# Patient Record
Sex: Female | Born: 1975 | Race: White | Hispanic: No | Marital: Married | Smoking: Former smoker
Health system: Southern US, Community
[De-identification: ages and names within clinical notes are randomized; demographics above are authoritative.]

## PROBLEM LIST (undated history)

## (undated) DIAGNOSIS — F329 Major depressive disorder, single episode, unspecified: Secondary | ICD-10-CM

## (undated) DIAGNOSIS — Z30432 Encounter for removal of intrauterine contraceptive device: Secondary | ICD-10-CM

## (undated) DIAGNOSIS — F419 Anxiety disorder, unspecified: Secondary | ICD-10-CM

## (undated) DIAGNOSIS — J301 Allergic rhinitis due to pollen: Secondary | ICD-10-CM

## (undated) DIAGNOSIS — F32A Depression, unspecified: Secondary | ICD-10-CM

## (undated) DIAGNOSIS — Z85850 Personal history of malignant neoplasm of thyroid: Secondary | ICD-10-CM

## (undated) DIAGNOSIS — E890 Postprocedural hypothyroidism: Secondary | ICD-10-CM

## (undated) DIAGNOSIS — Z975 Presence of (intrauterine) contraceptive device: Secondary | ICD-10-CM

## (undated) DIAGNOSIS — G43909 Migraine, unspecified, not intractable, without status migrainosus: Secondary | ICD-10-CM

## (undated) DIAGNOSIS — M26629 Arthralgia of temporomandibular joint, unspecified side: Secondary | ICD-10-CM

## (undated) DIAGNOSIS — D251 Intramural leiomyoma of uterus: Secondary | ICD-10-CM

## (undated) DIAGNOSIS — R87629 Unspecified abnormal cytological findings in specimens from vagina: Secondary | ICD-10-CM

## (undated) DIAGNOSIS — N200 Calculus of kidney: Secondary | ICD-10-CM

## (undated) HISTORY — DX: Depression, unspecified: F32.A

## (undated) HISTORY — DX: Unspecified abnormal cytological findings in specimens from vagina: R87.629

## (undated) HISTORY — DX: Encounter for removal of intrauterine contraceptive device: Z30.432

## (undated) HISTORY — DX: Intramural leiomyoma of uterus: D25.1

## (undated) HISTORY — PX: TONSILLECTOMY AND ADENOIDECTOMY: SHX28

## (undated) HISTORY — DX: Allergic rhinitis due to pollen: J30.1

## (undated) HISTORY — DX: Postprocedural hypothyroidism: E89.0

## (undated) HISTORY — DX: Arthralgia of temporomandibular joint, unspecified side: M26.629

## (undated) HISTORY — DX: Personal history of malignant neoplasm of thyroid: Z85.850

## (undated) HISTORY — DX: Migraine, unspecified, not intractable, without status migrainosus: G43.909

## (undated) HISTORY — DX: Anxiety disorder, unspecified: F41.9

## (undated) HISTORY — DX: Major depressive disorder, single episode, unspecified: F32.9

## (undated) HISTORY — DX: Calculus of kidney: N20.0

## (undated) HISTORY — DX: Presence of (intrauterine) contraceptive device: Z97.5

---
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
## 1999-01-22 ENCOUNTER — Emergency Department (HOSPITAL_COMMUNITY): Admission: EM | Admit: 1999-01-22 | Discharge: 1999-01-22 | Payer: Self-pay | Admitting: Emergency Medicine

## 1999-04-11 DIAGNOSIS — Z85850 Personal history of malignant neoplasm of thyroid: Secondary | ICD-10-CM

## 1999-04-11 HISTORY — PX: OTHER SURGICAL HISTORY: SHX169

## 1999-04-11 HISTORY — DX: Personal history of malignant neoplasm of thyroid: Z85.850

## 2000-04-10 DIAGNOSIS — E890 Postprocedural hypothyroidism: Secondary | ICD-10-CM

## 2000-04-10 HISTORY — DX: Postprocedural hypothyroidism: E89.0

## 2000-07-24 ENCOUNTER — Other Ambulatory Visit: Admission: RE | Admit: 2000-07-24 | Discharge: 2000-07-24 | Payer: Self-pay | Admitting: Obstetrics and Gynecology

## 2000-09-12 ENCOUNTER — Ambulatory Visit (HOSPITAL_COMMUNITY): Admission: RE | Admit: 2000-09-12 | Discharge: 2000-09-12 | Payer: Self-pay | Admitting: Endocrinology

## 2003-09-02 ENCOUNTER — Ambulatory Visit (HOSPITAL_COMMUNITY): Admission: RE | Admit: 2003-09-02 | Discharge: 2003-09-02 | Payer: Self-pay | Admitting: Pediatrics

## 2010-04-10 DIAGNOSIS — N200 Calculus of kidney: Secondary | ICD-10-CM

## 2010-04-10 HISTORY — DX: Calculus of kidney: N20.0

## 2011-08-09 LAB — HM PAP SMEAR: HM Pap smear: NORMAL

## 2011-09-27 ENCOUNTER — Other Ambulatory Visit (HOSPITAL_COMMUNITY)
Admission: RE | Admit: 2011-09-27 | Discharge: 2011-09-27 | Disposition: A | Discharge status: Home / Self Care | Payer: 59 | Source: Ambulatory Visit | Attending: Obstetrics and Gynecology | Admitting: Obstetrics and Gynecology

## 2011-09-27 DIAGNOSIS — Z1159 Encounter for screening for other viral diseases: Secondary | ICD-10-CM | POA: Insufficient documentation

## 2011-09-27 DIAGNOSIS — Z01419 Encounter for gynecological examination (general) (routine) without abnormal findings: Secondary | ICD-10-CM | POA: Insufficient documentation

## 2012-01-31 ENCOUNTER — Encounter: Payer: Self-pay | Admitting: Family Medicine

## 2012-01-31 ENCOUNTER — Ambulatory Visit (INDEPENDENT_AMBULATORY_CARE_PROVIDER_SITE_OTHER): Payer: 59 | Admitting: Family Medicine

## 2012-01-31 VITALS — BP 109/71 | HR 89 | Temp 98.6°F | Ht 59.0 in | Wt 127.0 lb

## 2012-01-31 DIAGNOSIS — E039 Hypothyroidism, unspecified: Secondary | ICD-10-CM

## 2012-01-31 DIAGNOSIS — Z85850 Personal history of malignant neoplasm of thyroid: Secondary | ICD-10-CM

## 2012-01-31 LAB — T3: T3, Total: 143.9 ng/dL (ref 80.0–204.0)

## 2012-01-31 NOTE — Assessment & Plan Note (Signed)
Check TSH, free T4 and T3 total today. We do want to keep her TSH near 0.2 or so with her hx of thyroid cancer. No RF of med today--we'll see if dose change of synthroid is needed.

## 2012-01-31 NOTE — Progress Notes (Signed)
Office Note 01/31/2012  CC:  Chief Complaint  Patient presents with  . Establish Care    HPI:  Melissa Mullen is a 36 y.o. White female who is here to establish care. Patient's most recent primary MD: [name] at [facility] in [place].   GYN care with [facility] in [place] ([name]). Old records in EPIC/HL were reviewed prior to or during today's visit.  Last TSH was July and was around 0.8 and brand name synthroid was increased from 100 to 112 mcg qd.  Past Medical History  Diagnosis Date  . Hypothyroidism   . Migraine syndrome     very sporadic, triggers unknown  . Anxiety   . Depression   . Hay fever   . Nephrolithiasis 2012    No imaging has been done.    Past Surgical History  Procedure Date  . Thyroidectomy 2001    +Rad Iodine ablation  . Tonsillectomy and adenoidectomy 1980s  . Wisdom tooth extraction 1996    No family history on file.  History   Social History  . Marital Status: Married    Spouse Name: N/A    Number of Children: N/A  . Years of Education: N/A   Occupational History  . Not on file.   Social History Main Topics  . Smoking status: Former Smoker    Types: Cigarettes    Quit date: 04/10/2010  . Smokeless tobacco: Never Used  . Alcohol Use: No  . Drug Use: No  . Sexually Active: Not on file   Other Topics Concern  . 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  No Tob, minimal alc.Exercise: sporadically    Outpatient Encounter Prescriptions as of 01/31/2012  Medication Sig Dispense Refill  . ALPRAZolam (XANAX) 0.25 MG tablet Take 0.25 mg by mouth as needed.      . cyclobenzaprine (FLEXERIL) 10 MG tablet Take 10 mg by mouth as needed.      . SUMAtriptan (IMITREX) 100 MG tablet Take 100 mg by mouth every 2 (two) hours as needed.      Marland Kitchen SYNTHROID 112 MCG tablet Take 1 tablet by mouth Daily.        Allergies  Allergen Reactions  . Codeine Nausea Only     ROS Review of Systems  Constitutional: Negative for fever and fatigue.  HENT: Negative for congestion and sore throat.   Eyes: Negative for visual disturbance.  Respiratory: Negative for cough.   Cardiovascular: Negative for chest pain.  Gastrointestinal: Negative for nausea and abdominal pain.  Genitourinary: Negative for dysuria.  Musculoskeletal: Negative for back pain and joint swelling.  Skin: Negative for rash.  Neurological: Negative for weakness and headaches.  Hematological: Negative for adenopathy.  Psychiatric/Behavioral: Negative for dysphoric mood.    PE; Blood pressure 109/71, pulse 89, temperature 98.6 F (37 C), temperature source Temporal, height 4\' 11"  (1.499 m), weight 127 lb (57.607 kg), SpO2 100.00%. Gen: Alert, well appearing.  Patient is oriented to person, place, time, and situation. JYN:WGNF: no injection, icteris, swelling, or exudate.  EOMI, PERRLA. Nose: no drainage or turbinate edema/swelling.  No injection or focal lesion.  Mouth: lips without lesion/swelling.  Oral mucosa pink and moist.  Dentition intact and without obvious caries or gingival swelling.  Oropharynx without erythema, exudate, or swelling.  Neck - No masses or thyroid tissue palpated or limitation in range of motion CV: RRR, no m/r/g.   LUNGS: CTA  bilat, nonlabored resps, good aeration in all lung fields. EXT: no clubbing, cyanosis, or edema.   Pertinent labs:  None today  ASSESSMENT AND PLAN:   New Pt; obtain old records.  Hypothyroidism Check TSH, free T4 and T3 total today. We do want to keep her TSH near 0.2 or so with her hx of thyroid cancer. No RF of med today--we'll see if dose change of synthroid is needed.   Flumist today.  An After Visit Summary was printed and given to the patient.   Return in about 6 months (around 07/31/2012) for 70mo f/u for recheck thyroid and CPE.

## 2012-02-01 LAB — TSH: TSH: 0.13 u[IU]/mL — ABNORMAL LOW (ref 0.35–5.50)

## 2012-02-01 LAB — T4, FREE: Free T4: 1.33 ng/dL (ref 0.60–1.60)

## 2012-02-06 ENCOUNTER — Other Ambulatory Visit: Payer: Self-pay | Admitting: *Deleted

## 2012-02-06 MED ORDER — LEVOTHYROXINE SODIUM 112 MCG PO TABS: 112.0000 ug | ORAL_TABLET | Freq: Every day | ORAL | Status: DC

## 2012-02-06 NOTE — Telephone Encounter (Signed)
Refill request for 90 DAY SYNTHROID Last filled- by previous MD Last seen- 01/31/12, Last TSH 01/31/12 Follow up - 6 months, 07/30/11 for OV and repeat TSH RX sent.

## 2012-07-29 ENCOUNTER — Encounter: Payer: 59 | Admitting: Family Medicine

## 2012-07-31 ENCOUNTER — Encounter: Payer: Self-pay | Admitting: Family Medicine

## 2012-07-31 ENCOUNTER — Ambulatory Visit (INDEPENDENT_AMBULATORY_CARE_PROVIDER_SITE_OTHER): Payer: 59 | Admitting: Family Medicine

## 2012-07-31 VITALS — BP 111/72 | HR 89 | Temp 97.9°F | Resp 14 | Ht 59.0 in | Wt 127.8 lb

## 2012-07-31 DIAGNOSIS — R5381 Other malaise: Secondary | ICD-10-CM

## 2012-07-31 DIAGNOSIS — Z0000 Encounter for general adult medical examination without abnormal findings: Secondary | ICD-10-CM

## 2012-07-31 DIAGNOSIS — E039 Hypothyroidism, unspecified: Secondary | ICD-10-CM

## 2012-07-31 LAB — CBC WITH DIFFERENTIAL/PLATELET
Basophils Absolute: 0 10*3/uL (ref 0.0–0.1)
Eosinophils Relative: 2 % (ref 0.0–5.0)
Hemoglobin: 13.4 g/dL (ref 12.0–15.0)
Lymphocytes Relative: 30.1 % (ref 12.0–46.0)
Monocytes Relative: 9.6 % (ref 3.0–12.0)
Platelets: 353 10*3/uL (ref 150.0–400.0)
RDW: 14.6 % (ref 11.5–14.6)
WBC: 5.9 10*3/uL (ref 4.5–10.5)

## 2012-07-31 LAB — COMPREHENSIVE METABOLIC PANEL
BUN: 12 mg/dL (ref 6–23)
CO2: 26 mEq/L (ref 19–32)
Creatinine, Ser: 0.7 mg/dL (ref 0.4–1.2)
GFR: 95.51 mL/min (ref >60.00)
Glucose, Bld: 79 mg/dL (ref 70–99)
Total Bilirubin: 0.7 mg/dL (ref 0.3–1.2)
Total Protein: 7.5 g/dL (ref 6.0–8.3)

## 2012-07-31 LAB — TSH: TSH: 0.26 u[IU]/mL — ABNORMAL LOW (ref 0.35–5.50)

## 2012-07-31 LAB — LIPID PANEL
Cholesterol: 183 mg/dL (ref 0–200)
HDL: 46.8 mg/dL (ref >39.00)
Triglycerides: 75 mg/dL (ref 0.0–149.0)

## 2012-07-31 MED ORDER — ALPRAZOLAM 0.25 MG PO TABS: ORAL_TABLET | ORAL | Status: DC

## 2012-07-31 MED ORDER — SUMATRIPTAN SUCCINATE 100 MG PO TABS: 100.0000 mg | ORAL_TABLET | ORAL | Status: DC | PRN

## 2012-07-31 MED ORDER — LEVOTHYROXINE SODIUM 112 MCG PO TABS: 112.0000 ug | ORAL_TABLET | Freq: Every day | ORAL | Status: DC

## 2012-07-31 NOTE — Progress Notes (Signed)
Office Note 07/31/2012  CC:  Chief Complaint  Patient presents with  . Follow-up    thyroid  . Annual Exam    CPE; fasting for labs    HPI:  Melissa Mullen is a 37 y.o. White female who is here for CPE. Basically says she is doing fine but admits she gets the "blahs" most days, which she goes on to explain means she gets fatigued/exhausted. She is the mother of a busy 65 year old who is currently playing baseball.  She admits to over-stretching/over-committing to things and stresses herself out unnecessarily.  Denies SOB or HAs.  Denies snoring or hx of witnessed apnea during sleep.   Has IUD. Admits her diet is "not the best".  She runs 1 mile a few days a week with her son's school running club. Has been compliant with synthroid. Says she rarely uses her cyclobenzaprine, xanax, and imitrex.   Past Medical History  Diagnosis Date  . Hypothyroidism   . Migraine syndrome     very sporadic, triggers unknown  . Anxiety   . Depression   . Hay fever   . Nephrolithiasis 2012    No imaging has been done.    Past Surgical History  Procedure Laterality Date  . Thyroidectomy  2001    +Rad Iodine ablation  . Tonsillectomy and adenoidectomy  1980s  . Wisdom tooth extraction  1996    No family history on file.  History   Social History  . Marital Status: Married    Spouse Name: N/A    Number of Children: N/A  . Years of Education: N/A   Occupational History  . Not on file.   Social History Main Topics  . Smoking status: Former Smoker    Types: Cigarettes    Quit date: 04/10/2010  . Smokeless tobacco: Never Used  . Alcohol Use: No  . Drug Use: No  . Sexually Active: Not on file   Other Topics Concern  . Not on file   Social History Narrative   Married, mother of one son 9 y/o.   Stay at home mom.     No Tob, minimal alc.   Exercise: sporadically          Outpatient Prescriptions Prior to Visit  Medication Sig Dispense Refill  . cyclobenzaprine  (FLEXERIL) 10 MG tablet Take 10 mg by mouth as needed.      . ALPRAZolam (XANAX) 0.25 MG tablet Take 0.25 mg by mouth as needed.      Marland Kitchen levothyroxine (SYNTHROID) 112 MCG tablet Take 1 tablet (112 mcg total) by mouth daily.  90 tablet  1  . SUMAtriptan (IMITREX) 100 MG tablet Take 100 mg by mouth every 2 (two) hours as needed.       No facility-administered medications prior to visit.    Allergies  Allergen Reactions  . Codeine Nausea Only    ROS Review of Systems  Constitutional: Negative for fever, chills, appetite change and fatigue.  HENT: Negative for ear pain, congestion, sore throat, neck stiffness and dental problem.   Eyes: Negative for discharge, redness and visual disturbance.  Respiratory: Negative for cough, chest tightness, shortness of breath and wheezing.   Cardiovascular: Negative for chest pain, palpitations and leg swelling.  Gastrointestinal: Negative for nausea, vomiting, abdominal pain, diarrhea and blood in stool.  Genitourinary: Positive for menstrual problem (some light bleeding/spotting for 1d q 2wks lately). Negative for dysuria, urgency, frequency, hematuria, flank pain and difficulty urinating.  Musculoskeletal: Negative for  myalgias, back pain, joint swelling and arthralgias.  Skin: Negative for pallor and rash.  Neurological: Positive for dizziness (occ random episodes of disequilibrium w/mild nausea---lasts seconds: no vertigo). Negative for speech difficulty, weakness and headaches.  Hematological: Negative for adenopathy. Does not bruise/bleed easily.  Psychiatric/Behavioral: Negative for confusion, sleep disturbance and dysphoric mood. The patient is not nervous/anxious.        States she has "long term memory" problems     PE; Blood pressure 111/72, pulse 89, temperature 97.9 F (36.6 C), temperature source Oral, resp. rate 14, height 4\' 11"  (1.499 m), weight 127 lb 12 oz (57.947 kg), SpO2 100.00%. Gen: Alert, well appearing.  Patient is oriented to  person, place, time, and situation. AFFECT: pleasant, lucid thought and speech. ENT: Ears: EACs clear, normal epithelium.  TMs with good light reflex and landmarks bilaterally.  Eyes: no injection, icteris, swelling, or exudate.  EOMI, PERRLA. Nose: no drainage or turbinate edema/swelling.  No injection or focal lesion.  Mouth: lips without lesion/swelling.  Oral mucosa pink and moist.  Dentition intact and without obvious caries or gingival swelling.  Oropharynx without erythema, exudate, or swelling.  Neck: supple/nontender.  No LAD, mass, or TM.  Carotid pulses 2+ bilaterally, without bruits. CV: RRR, no m/r/g.   LUNGS: CTA bilat, nonlabored resps, good aeration in all lung fields. ABD: soft, NT, ND, BS normal.  No hepatospenomegaly or mass.  No bruits. EXT: no clubbing, cyanosis, or edema.  Musculoskeletal: no joint swelling, erythema, warmth, or tenderness.  ROM of all joints intact. Skin - no sores or suspicious lesions or rashes or color changes  Pertinent labs:  None today  ASSESSMENT AND PLAN:   Health maintenance examination Reviewed age and gender appropriate health maintenance issues (prudent diet, regular exercise, health risks of tobacco and excessive alcohol, use of seatbelts, fire alarms in home, use of sunscreen).  Also reviewed age and gender appropriate health screening as well as vaccine recommendations. Need to offer Tdap next visit--pt left before getting this today. 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 Encouraged her to eat a more prudent, healthy diet and increase her exercise--esp as a stress reducer. Check CBC, CMET, FLP, and TSH today. Alprazolam, imitrex, and synthroid RF'd today.  An After Visit Summary was printed and given to the patient.   FOLLOW UP:  Return in about 6 months (around 01/30/2013) for f/u hypothyroidism.

## 2012-07-31 NOTE — Assessment & Plan Note (Signed)
Reviewed age and gender appropriate health maintenance issues (prudent diet, regular exercise, health risks of tobacco and excessive alcohol, use of seatbelts, fire alarms in home, use of sunscreen).  Also reviewed age and gender appropriate health screening as well as vaccine recommendations. Need to offer Tdap next visit--pt left before getting this today. 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 Encouraged her to eat a more prudent, healthy diet and increase her exercise--esp as a stress reducer. Check CBC, CMET, FLP, and TSH today. Alprazolam, imitrex, and synthroid RF'd today.

## 2012-09-30 ENCOUNTER — Other Ambulatory Visit: Payer: Self-pay | Admitting: Adult Health

## 2012-10-14 ENCOUNTER — Other Ambulatory Visit: Payer: Self-pay | Admitting: Family Medicine

## 2012-10-14 MED ORDER — LEVOTHYROXINE SODIUM 112 MCG PO TABS: 112.0000 ug | ORAL_TABLET | Freq: Every day | ORAL | Status: DC

## 2012-11-12 ENCOUNTER — Emergency Department (HOSPITAL_COMMUNITY)
Admission: EM | Admit: 2012-11-12 | Discharge: 2012-11-13 | Disposition: A | Discharge status: Home / Self Care | Payer: 59 | Attending: Emergency Medicine | Admitting: Emergency Medicine

## 2012-11-12 DIAGNOSIS — Z87891 Personal history of nicotine dependence: Secondary | ICD-10-CM | POA: Insufficient documentation

## 2012-11-12 DIAGNOSIS — Z85850 Personal history of malignant neoplasm of thyroid: Secondary | ICD-10-CM | POA: Insufficient documentation

## 2012-11-12 DIAGNOSIS — T63461A Toxic effect of venom of wasps, accidental (unintentional), initial encounter: Secondary | ICD-10-CM | POA: Insufficient documentation

## 2012-11-12 DIAGNOSIS — Y929 Unspecified place or not applicable: Secondary | ICD-10-CM | POA: Insufficient documentation

## 2012-11-12 DIAGNOSIS — E039 Hypothyroidism, unspecified: Secondary | ICD-10-CM | POA: Insufficient documentation

## 2012-11-12 DIAGNOSIS — Z79899 Other long term (current) drug therapy: Secondary | ICD-10-CM | POA: Insufficient documentation

## 2012-11-12 DIAGNOSIS — R21 Rash and other nonspecific skin eruption: Secondary | ICD-10-CM | POA: Insufficient documentation

## 2012-11-12 DIAGNOSIS — F329 Major depressive disorder, single episode, unspecified: Secondary | ICD-10-CM | POA: Insufficient documentation

## 2012-11-12 DIAGNOSIS — IMO0002 Reserved for concepts with insufficient information to code with codable children: Secondary | ICD-10-CM | POA: Insufficient documentation

## 2012-11-12 DIAGNOSIS — Y939 Activity, unspecified: Secondary | ICD-10-CM | POA: Insufficient documentation

## 2012-11-12 DIAGNOSIS — Z8679 Personal history of other diseases of the circulatory system: Secondary | ICD-10-CM | POA: Insufficient documentation

## 2012-11-12 DIAGNOSIS — F3289 Other specified depressive episodes: Secondary | ICD-10-CM | POA: Insufficient documentation

## 2012-11-12 DIAGNOSIS — Z87442 Personal history of urinary calculi: Secondary | ICD-10-CM | POA: Insufficient documentation

## 2012-11-12 DIAGNOSIS — F411 Generalized anxiety disorder: Secondary | ICD-10-CM | POA: Insufficient documentation

## 2012-11-12 DIAGNOSIS — Z8709 Personal history of other diseases of the respiratory system: Secondary | ICD-10-CM | POA: Insufficient documentation

## 2012-11-12 DIAGNOSIS — Z975 Presence of (intrauterine) contraceptive device: Secondary | ICD-10-CM | POA: Insufficient documentation

## 2012-11-12 MED ORDER — EPINEPHRINE HCL 1 MG/ML IJ SOLN: 0.3000 mg | Freq: Once | INTRAMUSCULAR | Status: AC

## 2012-11-12 MED ORDER — FAMOTIDINE 20 MG PO TABS
20.0000 mg | ORAL_TABLET | Freq: Once | ORAL | Status: AC
  Administered 2012-11-12: 20 mg via ORAL
  Filled 2012-11-12: qty 1

## 2012-11-12 MED ORDER — EPINEPHRINE HCL 1 MG/ML IJ SOLN
INTRAMUSCULAR | Status: AC
  Administered 2012-11-12: 0.3 mg via INTRAMUSCULAR
  Filled 2012-11-12: qty 1

## 2012-11-12 MED ORDER — EPINEPHRINE 0.3 MG/0.3ML IJ SOAJ: 0.3000 mg | Freq: Once | INTRAMUSCULAR | Status: DC

## 2012-11-12 MED ORDER — METHYLPREDNISOLONE SODIUM SUCC 125 MG IJ SOLR
125.0000 mg | Freq: Once | INTRAMUSCULAR | Status: AC
  Administered 2012-11-12: 125 mg via INTRAVENOUS
  Filled 2012-11-12: qty 2

## 2012-11-12 MED ORDER — PREDNISONE 20 MG PO TABS: 20.0000 mg | ORAL_TABLET | Freq: Two times a day (BID) | ORAL | Status: DC

## 2012-11-12 NOTE — ED Provider Notes (Signed)
CSN: [id number]     Arrival date & time 11/12/12  2158 History  This chart was scribed for Flint Melter, MD by Bennett Scrape, ED Scribe. This patient was seen in room APAH2/APAH2 and the patient's care was started at 10:05 PM.   CC: Allergic Reaction  Patient is a 37 y.o. female presenting with allergic reaction. The history is provided by the patient. No language interpreter was used.  Allergic Reaction Presenting symptoms: rash   Presenting symptoms: no difficulty swallowing   Prior allergic episodes:  Insect allergies Context: insect bite/sting   Relieved by:  Nothing Worsened by:  Nothing tried Ineffective treatments:  Antihistamines   HPI Comments: Melissa Mullen is a 37 y.o. female who presents to the Emergency Department complaining of an allergic reaction described as a diffuse hive like rash with associated itching after being stung by a bee 30 minutes ago. She reports taking 50 mg benadryl PTA. She denies SOB, trouble swallowing and facial swelling as associated symptoms. She reports a milder reaction one week ago after being stung three times. She states that she only had mild swelling at the sting sites during that episode. She was seen by her PCP afterwards but was not given an epi pen.   Past Medical History  Diagnosis Date  . Hypothyroidism   . Migraine syndrome     very sporadic, triggers unknown  . Anxiety   . Depression   . Hay fever   . Nephrolithiasis 2012    No imaging has been done.   Past Surgical History  Procedure Laterality Date  . Thyroidectomy  2001    +Rad Iodine ablation  . Tonsillectomy and adenoidectomy  1980s  . Wisdom tooth extraction  1996   No family history on file. History  Substance Use Topics  . Smoking status: Former Smoker    Types: Cigarettes    Quit date: 04/10/2010  . Smokeless tobacco: Never Used  . Alcohol Use: No   No OB history provided.  Review of Systems  HENT: Negative for facial swelling and trouble  swallowing.   Respiratory: Negative for shortness of breath.   Skin: Positive for rash.  All other systems reviewed and are negative.    Allergies  Bee venom and Codeine  Home Medications   Current Outpatient Rx  Name  Route  Sig  Dispense  Refill  . diphenhydrAMINE (BENADRYL) 25 MG tablet   Oral   Take 50 mg by mouth once as needed for itching or allergies (TAKEN ONCE IN RESPONSE TO ALLERGIC REACTION).         Marland Kitchen levothyroxine (SYNTHROID) 112 MCG tablet   Oral   Take 1 tablet (112 mcg total) by mouth daily.   90 tablet   1   . ALPRAZolam (XANAX) 0.25 MG tablet      1 tab po bid prn anxiety   30 tablet   1   . EPINEPHrine (EPI-PEN) 0.3 mg/0.3 mL SOAJ injection   Intramuscular   Inject 0.3 mLs (0.3 mg total) into the muscle once. For severe allergic reaction   1 Device   3   . predniSONE (DELTASONE) 20 MG tablet   Oral   Take 1 tablet (20 mg total) by mouth 2 (two) times daily.   10 tablet   0    Physical Exam  Nursing note and vitals reviewed. Constitutional: She is oriented to person, place, and time. She appears well-developed and well-nourished.  Uncomfortable   HENT:  Head: Normocephalic and  atraumatic.  Right Ear: External ear normal.  Left Ear: External ear normal.  No angioedema   Eyes: Conjunctivae and EOM are normal. Pupils are equal, round, and reactive to light.  Neck: Normal range of motion and phonation normal. Neck supple.  Cardiovascular: Regular rhythm, normal heart sounds and intact distal pulses.   mildly tachycardic   Pulmonary/Chest: Effort normal and breath sounds normal. She exhibits no bony tenderness.  Abdominal: Soft. Normal appearance. There is no tenderness.  Musculoskeletal: Normal range of motion.  Neurological: She is alert and oriented to person, place, and time. She has normal strength. No cranial nerve deficit or sensory deficit. She exhibits normal muscle tone. Coordination normal.  Skin: Skin is warm, dry and intact.   Scattered hives  Psychiatric: Her behavior is normal. Judgment and thought content normal. Her mood appears anxious.    ED Course   Procedures (including critical care time)  Medications  famotidine (PEPCID) tablet 20 mg (20 mg Oral Given 11/12/12 2216)  methylPREDNISolone sodium succinate (SOLU-MEDROL) 125 mg/2 mL injection 125 mg (125 mg Intravenous Given 11/12/12 2216)  EPINEPHrine (ADRENALIN) injection 0.3 mg (0.3 mg Intramuscular Given 11/12/12 2211)    Patient Vitals for the past 24 hrs:  BP Temp Temp src Pulse Resp SpO2 Height Weight  11/13/12 0001 103/53 mmHg - - 90 20 100 % - -  11/12/12 2209 128/56 mmHg 97.8 F (36.6 C) Oral 112 22 100 % 4\' 11"  (1.499 m) 139 lb (63.05 kg)   DIAGNOSTIC STUDIES: Oxygen Saturation is 100% on room air, normal by my interpretation.    COORDINATION OF CARE: 10:09 PM-Discussed treatment plan which includes medications with pt at bedside and pt agreed to plan.  11:00 PM-Pt rechecked and feels improved with medications listed above.    1. Bee sting reaction, initial encounter     MDM  Bee sting envenomation with allergic reaction. No evidence for anaphylaxis. She is improved after treatment in the ED. Doubt metabolic instability, serious bacterial infection or impending vascular collapse; the patient is stable for discharge.  Nursing Notes Reviewed/ Care Coordinated, and agree without changes. Applicable Imaging Reviewed.  Interpretation of Laboratory Data incorporated into ED treatment   Plan: Home Medications-  epinephrine, prednisone, Pepcid, Benadryl ; Home Treatments and Observation- watch for progressive symptoms; return here if the recommended treatment, does not improve the symptoms; Recommended follow up- PCP check up in 1 or 2 weeks    Flint Melter, MD 11/13/12 [phone]

## 2012-11-14 ENCOUNTER — Ambulatory Visit (INDEPENDENT_AMBULATORY_CARE_PROVIDER_SITE_OTHER): Payer: 59 | Admitting: Adult Health

## 2012-11-14 ENCOUNTER — Encounter: Payer: Self-pay | Admitting: Adult Health

## 2012-11-14 VITALS — BP 114/68 | HR 72 | Ht 60.5 in | Wt 130.0 lb

## 2012-11-14 DIAGNOSIS — Z975 Presence of (intrauterine) contraceptive device: Secondary | ICD-10-CM

## 2012-11-14 DIAGNOSIS — Z85850 Personal history of malignant neoplasm of thyroid: Secondary | ICD-10-CM | POA: Insufficient documentation

## 2012-11-14 DIAGNOSIS — Z01419 Encounter for gynecological examination (general) (routine) without abnormal findings: Secondary | ICD-10-CM

## 2012-11-14 HISTORY — DX: Presence of (intrauterine) contraceptive device: Z97.5

## 2012-11-14 NOTE — Patient Instructions (Addendum)
Physical in 1 year  Mammogram at 40  labs with Dr  Marvel Plan

## 2012-11-14 NOTE — Progress Notes (Signed)
Patient ID: Melissa Mullen, female   DOB: [birth year], 37 y.o.   MRN: [id number] History of Present Illness: Melissa Mullen is a 37 year old white female married in for a physical. Had normal pap with negative HPV last year.  Current Medications, Allergies, Past Medical History, Past Surgical History, Family History and Social History were reviewed in [employer] record.     Review of Systems: Patient denies any headaches, blurred vision, shortness of breath, chest pain, abdominal pain, problems with bowel movements, urination, or intercourse. No joint pain or mood changes.she was seen recently in ER for bee sting.she has noticed that she is having periods again with IUD and some pain occasionally.   Physical Exam:BP 114/68  Pulse 72  Ht 5' 0.5" (1.537 m)  Wt 130 lb (58.968 kg)  BMI 24.96 kg/m2  LMP 11/08/2012 General:  Well developed, well nourished, no acute distress Skin:  Warm and dry Neck:  Midline trachea, thyroid absent Lungs; Clear to auscultation bilaterally Breast:  No dominant palpable mass, retraction, or nipple discharge Cardiovascular: Regular rate and rhythm Abdomen:  Soft, non tender, no hepatosplenomegaly Pelvic:  External genitalia is normal in appearance.  The vagina is normal in appearance.The cervix is bulbous and everted at os IUD string visible but short.Marland Kitchen  Uterus is felt to be normal size, shape, and contour.  No  adnexal masses or tenderness noted. Extremities:  No swelling or varicosities noted Psych:  Alert and cooperative and seems happy, son in 7th grade   Impression: Yearly gyn exam-no pap History of thyroid cancer now hypothyroid  IUD in place   Plan: Physical in 1 year Mammogram at 30  Labs with Dr Marvel Plan Call if any problems

## 2012-11-14 NOTE — Addendum Note (Signed)
Addended by: Colen Darling on: 11/14/2012 12:38 PM   Modules accepted: Orders

## 2013-01-30 ENCOUNTER — Ambulatory Visit (INDEPENDENT_AMBULATORY_CARE_PROVIDER_SITE_OTHER): Payer: 59 | Admitting: Family Medicine

## 2013-01-30 ENCOUNTER — Encounter: Payer: Self-pay | Admitting: Family Medicine

## 2013-01-30 VITALS — BP 116/72 | HR 93 | Temp 98.6°F | Resp 16 | Ht 59.0 in | Wt 130.0 lb

## 2013-01-30 DIAGNOSIS — Z23 Encounter for immunization: Secondary | ICD-10-CM

## 2013-01-30 DIAGNOSIS — R5381 Other malaise: Secondary | ICD-10-CM

## 2013-01-30 DIAGNOSIS — E039 Hypothyroidism, unspecified: Secondary | ICD-10-CM

## 2013-01-30 DIAGNOSIS — IMO0001 Reserved for inherently not codable concepts without codable children: Secondary | ICD-10-CM

## 2013-01-30 LAB — SEDIMENTATION RATE: Sed Rate: 9 mm/hr (ref 0–22)

## 2013-01-30 LAB — CBC WITH DIFFERENTIAL/PLATELET
Basophils Relative: 0.7 % (ref 0.0–3.0)
Eosinophils Absolute: 0.1 10*3/uL (ref 0.0–0.7)
Eosinophils Relative: 1.8 % (ref 0.0–5.0)
HCT: 39.2 % (ref 36.0–46.0)
Hemoglobin: 13.1 g/dL (ref 12.0–15.0)
Lymphs Abs: 1.7 10*3/uL (ref 0.7–4.0)
MCHC: 33.3 g/dL (ref 30.0–36.0)
MCV: 90.8 fl (ref 78.0–100.0)
Monocytes Absolute: 0.5 10*3/uL (ref 0.1–1.0)
Neutro Abs: 3.2 10*3/uL (ref 1.4–7.7)
Neutrophils Relative %: 57.6 % (ref 43.0–77.0)
RBC: 4.32 Mil/uL (ref 3.87–5.11)
WBC: 5.6 10*3/uL (ref 4.5–10.5)

## 2013-01-30 LAB — COMPREHENSIVE METABOLIC PANEL
Alkaline Phosphatase: 43 U/L (ref 39–117)
BUN: 12 mg/dL (ref 6–23)
CO2: 28 mEq/L (ref 19–32)
Creatinine, Ser: 0.7 mg/dL (ref 0.4–1.2)
GFR: 103.38 mL/min (ref >60.00)
Glucose, Bld: 94 mg/dL (ref 70–99)
Sodium: 139 mEq/L (ref 135–145)
Total Bilirubin: 0.6 mg/dL (ref 0.3–1.2)
Total Protein: 7.3 g/dL (ref 6.0–8.3)

## 2013-01-30 NOTE — Assessment & Plan Note (Signed)
With myalgias, mental lethargy, excessive daytime sleepiness, depressed mood. She definitely feels like her mental/mood sx's are a secondary effect from her poor energy and malaise. Will check TSH, CPK total, ESR, parvo IgM and IgG, EBV panel, and lyme titers with reflex western blot. Will keep in mind possible OSA and primary mood disorder as causes if no lab abnormalities are found today.

## 2013-01-30 NOTE — Assessment & Plan Note (Signed)
W/ hx of thyroid cancer and she is s/p radioactive iodine ablation years ago, has been "released" by her endocrinologist, Dr. Rise Mu. Will check TSH today--she says she has "bounced around" regarding TSH in the past, and this may be responsible for her sx's the last few months.

## 2013-01-30 NOTE — Progress Notes (Signed)
OFFICE NOTE  01/30/2013  CC:  Chief Complaint  Patient presents with  . Hypothyroidism     HPI: Patient is a 37 y.o. Caucasian female who is here for 6 mo f/u hypothyroidism and anxiety. Feels 2 mo hx of feeling very fatigued, esp with any activity, achy diffusely, cold intolerance, mental sluggishness, excessive daytime sleepiness.  No known snoring or apnea spells in sleep.   Doesn't wake up feeling rested.  No OTC meds or herbals prior to onset of this.   No fevers, no excessive wt gain, no mouth sores or rashes.  No muscle tenderness, no joint swelling.  She did have a bad ST for about 1 mo but thought this was related to PND because taking sudafed did help.  ROS: she got stung by a wasp just prior to onset of her current malaise/fatigue/myalgias---apparently had rash and some systemic rxn, then had a sting on ankle about 1 mo later (?yellow jacket?) that caused mild anaphylaxis sx's so she was given epi in the ED, epi pen rx'd.  Pertinent PMH:  Past Medical History  Diagnosis Date  . Hypothyroidism   . Migraine syndrome     very sporadic, triggers unknown  . Anxiety   . Depression   . Hay fever   . Nephrolithiasis 2012    No imaging has been done.  . Cancer 2001    thyroid  . History of thyroid cancer 11/14/2012  . IUD (intrauterine device) in place 11/14/2012    Put in 2011   Past surgical, social, and family history reviewed and no changes noted since last office visit.  MEDS:  Outpatient Prescriptions Prior to Visit  Medication Sig Dispense Refill  . ALPRAZolam (XANAX) 0.25 MG tablet 1 tab po bid prn anxiety  30 tablet  1  . diphenhydrAMINE (BENADRYL) 25 MG tablet Take 50 mg by mouth once as needed for itching or allergies (TAKEN ONCE IN RESPONSE TO ALLERGIC REACTION).      Marland Kitchen EPINEPHrine (EPI-PEN) 0.3 mg/0.3 mL SOAJ injection Inject 0.3 mLs (0.3 mg total) into the muscle once. For severe allergic reaction  1 Device  3  . levonorgestrel (MIRENA) 20 MCG/24HR IUD 1 each  by Intrauterine route once.      Marland Kitchen levothyroxine (SYNTHROID) 112 MCG tablet Take 1 tablet (112 mcg total) by mouth daily.  90 tablet  1  . Famotidine (PEPCID PO) Take by mouth 2 (two) times daily.      . predniSONE (DELTASONE) 20 MG tablet Take 1 tablet (20 mg total) by mouth 2 (two) times daily.  10 tablet  0   No facility-administered medications prior to visit.    PE: Blood pressure 116/72, pulse 93, temperature 98.6 F (37 C), temperature source Temporal, resp. rate 16, height 4\' 11"  (1.499 m), weight 130 lb (58.968 kg), SpO2 100.00%. Gen: Alert, well appearing.  Patient is oriented to person, place, time, and situation. ION:GEXB: no injection, icteris, swelling, or exudate.  EOMI, PERRLA. Nose: no drainage or turbinate edema/swelling.  No injection or focal lesion.  Mouth: lips without lesion/swelling.  Oral mucosa pink and moist.  Dentition intact and without obvious caries or gingival swelling.  Oropharynx without erythema, exudate, or swelling.  Neck - No masses or thyromegaly or limitation in range of motion CV: RRR, no m/r/g.   LUNGS: CTA bilat, nonlabored resps, good aeration in all lung fields. ABD: soft, NT except some TTP in LUQ with deep palpation but I could not feel her spleen, ND,   No  hepatospenomegaly or mass.   EXT: no clubbing, cyanosis, or edema. Neuro: CN 2-12 intact bilaterally, strength 5/5 in proximal and distal upper extremities and lower extremities bilaterally.  No tremor.  No disdiadochokinesis.  No ataxia.  Upper extremity and lower extremity DTRs symmetric.  No pronator drift.  LAB: none today   IMPRESSION AND PLAN:  Hypothyroidism W/ hx of thyroid cancer and she is s/p radioactive iodine ablation years ago, has been "released" by her endocrinologist, Dr. Rise Mu. Will check TSH today--she says she has "bounced around" regarding TSH in the past, and this may be responsible for her sx's the last few months.  Other malaise and fatigue With myalgias,  mental lethargy, excessive daytime sleepiness, depressed mood. She definitely feels like her mental/mood sx's are a secondary effect from her poor energy and malaise. Will check TSH, CPK total, ESR, parvo IgM and IgG, EBV panel, and lyme titers with reflex western blot. Will keep in mind possible OSA and primary mood disorder as causes if no lab abnormalities are found today.   An After Visit Summary was printed and given to the patient.  FOLLOW UP: 6 mo

## 2013-01-31 ENCOUNTER — Other Ambulatory Visit: Payer: Self-pay | Admitting: Family Medicine

## 2013-01-31 LAB — EPSTEIN-BARR VIRUS VCA ANTIBODY PANEL
EBV VCA IgG: >750 U/mL — ABNORMAL HIGH (ref <18.0)
EBV VCA IgM: <10 U/mL (ref <36.0)

## 2013-01-31 LAB — LYME AB/WESTERN BLOT REFLEX: B burgdorferi Ab IgG+IgM: 0.51 {ISR}

## 2013-01-31 MED ORDER — LEVOTHYROXINE SODIUM 112 MCG PO TABS: 112.0000 ug | ORAL_TABLET | Freq: Every day | ORAL | Status: DC

## 2013-02-06 LAB — PARVOVIRUS B19 ANTIBODY, IGG AND IGM: Parovirus B19 IgG Abs: 6.5 index — ABNORMAL HIGH (ref <0.9)

## 2013-04-07 ENCOUNTER — Other Ambulatory Visit: Payer: Self-pay | Admitting: Family Medicine

## 2013-04-07 MED ORDER — LEVOTHYROXINE SODIUM 112 MCG PO TABS: 112.0000 ug | ORAL_TABLET | Freq: Every day | ORAL | Status: DC

## 2013-07-31 ENCOUNTER — Ambulatory Visit (INDEPENDENT_AMBULATORY_CARE_PROVIDER_SITE_OTHER): Payer: 59 | Admitting: Family Medicine

## 2013-07-31 ENCOUNTER — Encounter: Payer: Self-pay | Admitting: Family Medicine

## 2013-07-31 VITALS — BP 103/68 | HR 80 | Temp 99.1°F | Resp 16 | Ht 59.0 in | Wt 134.0 lb

## 2013-07-31 DIAGNOSIS — R5383 Other fatigue: Secondary | ICD-10-CM

## 2013-07-31 DIAGNOSIS — M2669 Other specified disorders of temporomandibular joint: Secondary | ICD-10-CM

## 2013-07-31 DIAGNOSIS — M26629 Arthralgia of temporomandibular joint, unspecified side: Secondary | ICD-10-CM

## 2013-07-31 DIAGNOSIS — E039 Hypothyroidism, unspecified: Secondary | ICD-10-CM

## 2013-07-31 DIAGNOSIS — R5381 Other malaise: Secondary | ICD-10-CM

## 2013-07-31 LAB — TSH: TSH: 2.54 u[IU]/mL (ref 0.35–5.50)

## 2013-07-31 MED ORDER — LEVOTHYROXINE SODIUM 112 MCG PO TABS: 112.0000 ug | ORAL_TABLET | Freq: Every day | ORAL | Status: DC

## 2013-07-31 MED ORDER — CYCLOBENZAPRINE HCL 5 MG PO TABS: ORAL_TABLET | ORAL | Status: DC

## 2013-07-31 MED ORDER — ALPRAZOLAM 0.25 MG PO TABS: ORAL_TABLET | ORAL | Status: DC

## 2013-07-31 NOTE — Progress Notes (Signed)
Pre visit review using our clinic review tool, if applicable. No additional management support is needed unless otherwise documented below in the visit note. 

## 2013-07-31 NOTE — Progress Notes (Signed)
OFFICE NOTE  07/31/2013  CC:  Chief Complaint  Patient presents with  . Follow-up    6 month     HPI: Patient is a 38 y.o. Caucasian female who is here for 6 mo f/u hypothyroidism and anxiety. Extensive lab w/u last visit for fatigue--all normal, including TSH. Doing ok, no acute complaints. Her dentist had been giving her flexeril over the years for TMJ, and now her dentist wants to turn over the prescribing of this med to me.  Pt uses 5mg  infrequently and it helps significantly.  Pertinent PMH:  Past medical, surgical, social, and family history reviewed and no changes are noted since last office visit.  MEDS:  Outpatient Prescriptions Prior to Visit  Medication Sig Dispense Refill  . ALPRAZolam (XANAX) 0.25 MG tablet 1 tab po bid prn anxiety  30 tablet  1  . diphenhydrAMINE (BENADRYL) 25 MG tablet Take 50 mg by mouth once as needed for itching or allergies (TAKEN ONCE IN RESPONSE TO ALLERGIC REACTION).      Marland Kitchen levonorgestrel (MIRENA) 20 MCG/24HR IUD 1 each by Intrauterine route once.      Marland Kitchen levothyroxine (SYNTHROID) 112 MCG tablet Take 1 tablet (112 mcg total) by mouth daily.  90 tablet  1  . EPINEPHrine (EPI-PEN) 0.3 mg/0.3 mL SOAJ injection Inject 0.3 mLs (0.3 mg total) into the muscle once. For severe allergic reaction  1 Device  3   No facility-administered medications prior to visit.    PE: Blood pressure 103/68, pulse 80, temperature 99.1 F (37.3 C), temperature source Temporal, resp. rate 16, height 4\' 11"  (1.499 m), weight 134 lb (60.782 kg), SpO2 100.00%. Gen: Alert, well appearing.  Patient is oriented to person, place, time, and situation. XBD:ZHGD: no injection, icteris, swelling, or exudate.  EOMI, PERRLA. TMJ: nontender, no subluxation Mouth: lips without lesion/swelling.  Oral mucosa pink and moist. Oropharynx without erythema, exudate, or swelling.  Neck - No masses or thyromegaly or limitation in range of motion CV: RRR, no m/r/g.   LUNGS: CTA bilat,  nonlabored resps, good aeration in all lung fields.   IMPRESSION AND PLAN:  1) Hypothyroidism: stable.  Recheck TSH today. RF med, dose as appropriate.  2) Anxiety; general + situational and does pretty well on prn xanax and doesn't take this much. Renewed rx today.  3) TMJ disorder: I'm ok with assuming the rx responsibilities for her flexeril that she uses prn for this. Rx for #30, RF x 1 given today.  4) Chronic fatigue: stable.  Encouraged adequate sleep and exercise.  FOLLOW UP: 107mo

## 2014-01-29 ENCOUNTER — Ambulatory Visit: Payer: 59 | Admitting: Family Medicine

## 2014-02-03 ENCOUNTER — Encounter: Payer: Self-pay | Admitting: Family Medicine

## 2014-02-03 ENCOUNTER — Ambulatory Visit (INDEPENDENT_AMBULATORY_CARE_PROVIDER_SITE_OTHER): Payer: 59 | Admitting: Family Medicine

## 2014-02-03 VITALS — BP 110/71 | HR 78 | Temp 99.0°F | Resp 18 | Ht 59.0 in | Wt 133.0 lb

## 2014-02-03 DIAGNOSIS — R5383 Other fatigue: Secondary | ICD-10-CM | POA: Insufficient documentation

## 2014-02-03 DIAGNOSIS — R5382 Chronic fatigue, unspecified: Secondary | ICD-10-CM

## 2014-02-03 DIAGNOSIS — J309 Allergic rhinitis, unspecified: Secondary | ICD-10-CM | POA: Insufficient documentation

## 2014-02-03 DIAGNOSIS — E890 Postprocedural hypothyroidism: Secondary | ICD-10-CM

## 2014-02-03 DIAGNOSIS — E039 Hypothyroidism, unspecified: Secondary | ICD-10-CM

## 2014-02-03 LAB — CBC WITH DIFFERENTIAL/PLATELET
BASOS PCT: 0.8 % (ref 0.0–3.0)
Basophils Absolute: 0 10*3/uL (ref 0.0–0.1)
EOS PCT: 0.8 % (ref 0.0–5.0)
Eosinophils Absolute: 0.1 10*3/uL (ref 0.0–0.7)
HEMATOCRIT: 43.3 % (ref 36.0–46.0)
HEMOGLOBIN: 14.2 g/dL (ref 12.0–15.0)
LYMPHS ABS: 1.6 10*3/uL (ref 0.7–4.0)
Lymphocytes Relative: 24.8 % (ref 12.0–46.0)
MCHC: 32.9 g/dL (ref 30.0–36.0)
MCV: 90.1 fl (ref 78.0–100.0)
MONO ABS: 0.6 10*3/uL (ref 0.1–1.0)
Monocytes Relative: 9.1 % (ref 3.0–12.0)
Neutro Abs: 4.1 10*3/uL (ref 1.4–7.7)
Neutrophils Relative %: 64.5 % (ref 43.0–77.0)
Platelets: 335 10*3/uL (ref 150.0–400.0)
RBC: 4.8 Mil/uL (ref 3.87–5.11)
RDW: 14.2 % (ref 11.5–15.5)
WBC: 6.4 10*3/uL (ref 4.0–10.5)

## 2014-02-03 LAB — COMPREHENSIVE METABOLIC PANEL
ALK PHOS: 48 U/L (ref 39–117)
ALT: 11 U/L (ref 0–35)
AST: 17 U/L (ref 0–37)
Albumin: 3.9 g/dL (ref 3.5–5.2)
BILIRUBIN TOTAL: 0.7 mg/dL (ref 0.2–1.2)
BUN: 14 mg/dL (ref 6–23)
CO2: 25 mEq/L (ref 19–32)
Calcium: 9.6 mg/dL (ref 8.4–10.5)
Chloride: 105 mEq/L (ref 96–112)
Creatinine, Ser: 0.8 mg/dL (ref 0.4–1.2)
GFR: 86.48 mL/min (ref >60.00)
Glucose, Bld: 90 mg/dL (ref 70–99)
POTASSIUM: 4.3 meq/L (ref 3.5–5.1)
Sodium: 137 mEq/L (ref 135–145)
Total Protein: 7.2 g/dL (ref 6.0–8.3)

## 2014-02-03 LAB — VITAMIN B12: Vitamin B-12: 337 pg/mL (ref 211–911)

## 2014-02-03 LAB — TSH: TSH: 1.01 u[IU]/mL (ref 0.35–4.50)

## 2014-02-03 LAB — SEDIMENTATION RATE: Sed Rate: 9 mm/hr (ref 0–22)

## 2014-02-03 MED ORDER — FLUTICASONE PROPIONATE 50 MCG/ACT NA SUSP: NASAL | Status: DC

## 2014-02-03 MED ORDER — MONTELUKAST SODIUM 10 MG PO TABS: 10.0000 mg | ORAL_TABLET | Freq: Every day | ORAL | Status: DC

## 2014-02-03 MED ORDER — EPINEPHRINE 0.3 MG/0.3ML IJ SOAJ: 0.3000 mg | Freq: Once | INTRAMUSCULAR | Status: DC

## 2014-02-03 NOTE — Progress Notes (Signed)
Pre visit review using our clinic review tool, if applicable. No additional management support is needed unless otherwise documented below in the visit note. 

## 2014-02-03 NOTE — Assessment & Plan Note (Signed)
This is poorly controlled but I don't think this explains all of her chronic fatigue. I recommended she switch/rotate nonsedating antihistamines q3-4 mo, start trial of flonase and also trial of singulair 10mg  qhs. She'll call if singulair and flonase helping and we'll go ahead and do 3 mo supply mail order.

## 2014-02-03 NOTE — Assessment & Plan Note (Signed)
Suspect chronic fatigue syndrome. Looking back at 01/2013 visit she voiced the identical complaints and we did blood w/u at that time and it was unremarkable, as was her physical exam. I will check CBC, CMET, ESR, and TSH today, and I asked the patient to do some reading on her own about chronic fatigue syndrome. I also gave her a quality of sleep questionnaire for her to take home and have husband help fill out, fax it back to [place] to make sure we are not missing OSA.  However, at this time she doesn't give sufficient history to support this dx of further w/u with sleep study. I seriously doubt she has a primary mood disorder as the cause of her fatigue.

## 2014-02-03 NOTE — Progress Notes (Signed)
OFFICE NOTE  02/03/2014  CC:  Chief Complaint  Patient presents with  . Follow-up    fasting  . Medication Refill   HPI: Patient is a 38 y.o. Caucasian female who is here for 6 mo f/u chronic anxiety and hypothyroidism. Says she stays tired constantly, all she wants to do is go to bed, has been going on for years, continues to get worse. Has some depressed mood that does not last long and is from frustration of not having energy/motivation. Foggy headed is a description she uses a few times today.  No signif pain problem except when at the extreme of fatigue she feels generally achy.  No fevers.  No abnormal wt loss. Takes xanax avg of 1 time per month, same with flexeril. Compliant with 112 mcg levoxyl qd.  Husband says she snores.  No mention of apneic periods.  Says she stays stopped up in nose and ears. Takes zyrtec nightly and mucinex sometimes.   No nasal spray. +Excessive daytime somnolence.  Occ wakes up out of a dream with a gasp/cry out but it is dream related and she denies suffocation feeling.  She does not feel rested upon awakening in morning.  Has no trouble falling asleep.  No RLS. Anxiety/life stress is no more than her usual.  Pertinent PMH:  Past medical, surgical, social, and family history reviewed and no changes are noted since last office visit.  MEDS:  Outpatient Prescriptions Prior to Visit  Medication Sig Dispense Refill  . ALPRAZolam (XANAX) 0.25 MG tablet 1 tab po bid prn anxiety  30 tablet  1  . cetirizine (ZYRTEC) 10 MG tablet Take 10 mg by mouth daily.      . cyclobenzaprine (FLEXERIL) 5 MG tablet 1 tab po tid prn TMJ pain  30 tablet  1  . diphenhydrAMINE (BENADRYL) 25 MG tablet Take 50 mg by mouth once as needed for itching or allergies (TAKEN ONCE IN RESPONSE TO ALLERGIC REACTION).      Marland Kitchen levonorgestrel (MIRENA) 20 MCG/24HR IUD 1 each by Intrauterine route once.      Marland Kitchen levothyroxine (SYNTHROID) 112 MCG tablet Take 1 tablet (112 mcg total) by mouth  daily.  30 tablet  0  . EPINEPHrine (EPI-PEN) 0.3 mg/0.3 mL SOAJ injection Inject 0.3 mLs (0.3 mg total) into the muscle once. For severe allergic reaction  1 Device  3   No facility-administered medications prior to visit.    PE: Blood pressure 110/71, pulse 78, temperature 99 F (37.2 C), temperature source Temporal, resp. rate 18, height 4' 11"  (1.499 m), weight 133 lb (60.328 kg), SpO2 100.00%. Gen: Alert, well appearing.  Patient is oriented to person, place, time, and situation. ENT: Ears: EACs clear, normal epithelium.  TMs with good light reflex and landmarks bilaterally.  Eyes: no injection, icteris, swelling, or exudate.  EOMI, PERRLA. Nose: no drainage or turbinate edema/swelling.  No injection or focal lesion.  Mouth: lips without lesion/swelling.  Oral mucosa pink and moist.  Dentition intact and without obvious caries or gingival swelling.  Oropharynx without erythema, exudate, or swelling.  Neck - No masses or thyromegaly or limitation in range of motion CV: RRR, no m/r/g.   LUNGS: CTA bilat, nonlabored resps, good aeration in all lung fields. EXT: no clubbing, cyanosis, or edema.  No muscle or joint tenderness, swelling, or erythema.   SKIN: no rash. Neuro: CN 2-12 intact bilaterally, strength 5/5 in proximal and distal upper extremities and lower extremities bilaterally. No tremor.  No ataxia.  LAB: none today  IMPRESSION AND PLAN:  Hypothyroidism Hx of thyroid cancer. TSH check today. Goal TSH very near lower limit of normal. Pt needs brand name only synthroid and she needs the med phoned in, not eRx'd or mailed or printed.  Chronic fatigue Suspect chronic fatigue syndrome. Looking back at 01/2013 visit she voiced the identical complaints and we did blood w/u at that time and it was unremarkable, as was her physical exam. I will check CBC, CMET, ESR, and TSH today, and I asked the patient to do some reading on her own about chronic fatigue syndrome. I also gave  her a quality of sleep questionnaire for her to take home and have husband help fill out, fax it back to [place] to make sure we are not missing OSA.  However, at this time she doesn't give sufficient history to support this dx of further w/u with sleep study. I seriously doubt she has a primary mood disorder as the cause of her fatigue.  Chronic allergic rhinitis This is poorly controlled but I don't think this explains all of her chronic fatigue. I recommended she switch/rotate nonsedating antihistamines q3-4 mo, start trial of flonase and also trial of singulair 44m qhs. She'll call if singulair and flonase helping and we'll go ahead and do 3 mo supply mail order.  She'll get flu vaccine next week when she returns here with her son.  An After Visit Summary was printed and given to the patient.  FOLLOW UP: 6 mo for fasting CPE

## 2014-02-03 NOTE — Assessment & Plan Note (Signed)
Hx of thyroid cancer. TSH check today. Goal TSH very near lower limit of normal. Pt needs brand name only synthroid and she needs the med phoned in, not eRx'd or mailed or printed.

## 2014-02-04 ENCOUNTER — Other Ambulatory Visit: Payer: Self-pay | Admitting: Family Medicine

## 2014-02-09 ENCOUNTER — Encounter: Payer: Self-pay | Admitting: Family Medicine

## 2014-02-18 ENCOUNTER — Ambulatory Visit: Payer: 59

## 2014-02-18 ENCOUNTER — Ambulatory Visit (INDEPENDENT_AMBULATORY_CARE_PROVIDER_SITE_OTHER): Payer: 59

## 2014-02-18 DIAGNOSIS — Z23 Encounter for immunization: Secondary | ICD-10-CM

## 2014-02-24 ENCOUNTER — Other Ambulatory Visit: Payer: Self-pay | Admitting: Family Medicine

## 2014-02-25 NOTE — Telephone Encounter (Signed)
Rf request for xanax.  Patient last OV was 02/03/14.  Last Rx printed 07/31/13 x 1 rf.  Please advise.

## 2014-04-10 HISTORY — PX: IUD REMOVAL: SHX5392

## 2014-05-11 ENCOUNTER — Encounter: Payer: Self-pay | Admitting: Adult Health

## 2014-05-11 ENCOUNTER — Ambulatory Visit (INDEPENDENT_AMBULATORY_CARE_PROVIDER_SITE_OTHER): Payer: 59 | Admitting: Adult Health

## 2014-05-11 ENCOUNTER — Other Ambulatory Visit (HOSPITAL_COMMUNITY)
Admission: RE | Admit: 2014-05-11 | Discharge: 2014-05-11 | Disposition: A | Discharge status: Home / Self Care | Payer: 59 | Source: Ambulatory Visit | Attending: Adult Health | Admitting: Adult Health

## 2014-05-11 VITALS — BP 104/70 | HR 76 | Ht 59.5 in | Wt 135.0 lb

## 2014-05-11 DIAGNOSIS — Z1212 Encounter for screening for malignant neoplasm of rectum: Secondary | ICD-10-CM

## 2014-05-11 DIAGNOSIS — Z01419 Encounter for gynecological examination (general) (routine) without abnormal findings: Secondary | ICD-10-CM

## 2014-05-11 DIAGNOSIS — Z30432 Encounter for removal of intrauterine contraceptive device: Secondary | ICD-10-CM

## 2014-05-11 DIAGNOSIS — Z1151 Encounter for screening for human papillomavirus (HPV): Secondary | ICD-10-CM | POA: Insufficient documentation

## 2014-05-11 DIAGNOSIS — Z1211 Encounter for screening for malignant neoplasm of colon: Secondary | ICD-10-CM | POA: Insufficient documentation

## 2014-05-11 DIAGNOSIS — Z3049 Encounter for surveillance of other contraceptives: Secondary | ICD-10-CM

## 2014-05-11 DIAGNOSIS — Z01411 Encounter for gynecological examination (general) (routine) with abnormal findings: Secondary | ICD-10-CM | POA: Insufficient documentation

## 2014-05-11 DIAGNOSIS — Z309 Encounter for contraceptive management, unspecified: Secondary | ICD-10-CM | POA: Insufficient documentation

## 2014-05-11 DIAGNOSIS — Z3202 Encounter for pregnancy test, result negative: Secondary | ICD-10-CM

## 2014-05-11 DIAGNOSIS — Z30011 Encounter for initial prescription of contraceptive pills: Secondary | ICD-10-CM

## 2014-05-11 HISTORY — DX: Encounter for removal of intrauterine contraceptive device: Z30.432

## 2014-05-11 LAB — POCT URINE PREGNANCY: PREG TEST UR: NEGATIVE

## 2014-05-11 MED ORDER — NORETHINDRONE 0.35 MG PO TABS: 1.0000 | ORAL_TABLET | Freq: Every day | ORAL | Status: DC

## 2014-05-11 NOTE — Patient Instructions (Signed)
Start micronor today use condoms Physical in  1 year yearNorethindrone tablets (contraception) What is this medicine? NORETHINDRONE (nor eth IN drone) is an oral contraceptive. The product contains a female hormone known as a progestin. It is used to prevent pregnancy. This medicine may be used for other purposes; ask your health care provider or pharmacist if you have questions. COMMON BRAND NAME(S): Camila, Deblitane 28-Day, Errin, Heather, [place], Jolivette, Almond, Nor-QD, Nora-BE, Norlyroc, Ortho Micronor, [employer] 28-Day What should I tell my health care provider before I take this medicine? They need to know if you have any of these conditions: -blood vessel disease or blood clots -breast, cervical, or vaginal cancer -diabetes -heart disease -kidney disease -liver disease -mental depression -migraine -seizures -stroke -vaginal bleeding -an unusual or allergic reaction to norethindrone, other medicines, foods, dyes, or preservatives -pregnant or trying to get pregnant -breast-feeding How should I use this medicine? Take this medicine by mouth with a glass of water. You may take it with or without food. Follow the directions on the prescription label. Take this medicine at the same time each day and in the order directed on the package. Do not take your medicine more often than directed. Contact your pediatrician regarding the use of this medicine in children. Special care may be needed. This medicine has been used in female children who have started having menstrual periods. A patient package insert for the product will be given with each prescription and refill. Read this sheet carefully each time. The sheet may change frequently. Overdosage: If you think you have taken too much of this medicine contact a poison control center or emergency room at once. NOTE: This medicine is only for you. Do not share this medicine with others. What if I miss a dose? Try not to miss a dose.  Every time you miss a dose or take a dose late your chance of pregnancy increases. When 1 pill is missed (even if only 3 hours late), take the missed pill as soon as possible and continue taking a pill each day at the regular time (use a back up method of birth control for the next 48 hours). If more than 1 dose is missed, use an additional birth control method for the rest of your pill pack until menses occurs. Contact your health care professional if more than 1 dose has been missed. What may interact with this medicine? Do not take this medicine with any of the following medications: -amprenavir or fosamprenavir -bosentan This medicine may also interact with the following medications: -antibiotics or medicines for infections, especially rifampin, rifabutin, rifapentine, and griseofulvin, and possibly penicillins or tetracyclines -aprepitant -barbiturate medicines, such as phenobarbital -carbamazepine -felbamate -modafinil -oxcarbazepine -phenytoin -ritonavir or other medicines for HIV infection or AIDS -St. John's wort -topiramate This list may not describe all possible interactions. Give your health care provider a list of all the medicines, herbs, non-prescription drugs, or dietary supplements you use. Also tell them if you smoke, drink alcohol, or use illegal drugs. Some items may interact with your medicine. What should I watch for while using this medicine? Visit your doctor or health care professional for regular checks on your progress. You will need a regular breast and pelvic exam and Pap smear while on this medicine. Use an additional method of birth control during the first cycle that you take these tablets. If you have any reason to think you are pregnant, stop taking this medicine right away and contact your doctor or health care professional. If  you are taking this medicine for hormone related problems, it may take several cycles of use to see improvement in your  condition. This medicine does not protect you against HIV infection (AIDS) or any other sexually transmitted diseases. What side effects may I notice from receiving this medicine? Side effects that you should report to your doctor or health care professional as soon as possible: -breast tenderness or discharge -pain in the abdomen, chest, groin or leg -severe headache -skin rash, itching, or hives -sudden shortness of breath -unusually weak or tired -vision or speech problems -yellowing of skin or eyes Side effects that usually do not require medical attention (report to your doctor or health care professional if they continue or are bothersome): -changes in sexual desire -change in menstrual flow -facial hair growth -fluid retention and swelling -headache -irritability -nausea -weight gain or loss This list may not describe all possible side effects. Call your doctor for medical advice about side effects. You may report side effects to FDA at 1-800-FDA-1088. Where should I keep my medicine? Keep out of the reach of children. Store at room temperature between 15 and 30 degrees C (59 and 86 degrees F). Throw away any unused medicine after the expiration date. NOTE: This sheet is a summary. It may not cover all possible information. If you have questions about this medicine, talk to your doctor, pharmacist, or health care provider.  2015, Elsevier/Gold Standard. (2011-12-15 16:41:35) Mammogram at 61

## 2014-05-11 NOTE — Progress Notes (Addendum)
Patient ID: Melissa Mullen, female   DOB: [birth year], 39 y.o.   MRN: [id number] History of Present Illness: Melissa Mullen is a 39 year old white female,married in for a pap and physical and have her IUD removed.   Current Medications, Allergies, Past Medical History, Past Surgical History, Family History and Social History were reviewed in [employer] record.     Review of Systems: Patient denies any headaches, blurred vision, shortness of breath, chest pain, abdominal pain, problems with bowel movements, urination, or intercourse. 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    Physical Exam:BP 104/70 mmHg  Pulse 76  Ht 4' 11.5" (1.511 m)  Wt 135 lb (61.236 kg)  BMI 26.82 kg/m2  LMP 01/24/2016UPT negative. General:  Well developed, well nourished, no acute distress Skin:  Warm and dry Neck:  Midline trachea, thyroid absent has well healed scar. Lungs; Clear to auscultation bilaterally Breast:  No dominant palpable mass, retraction, or nipple discharge Cardiovascular: Regular rate and rhythm Abdomen:  Soft, non tender, no hepatosplenomegaly Pelvic:  External genitalia is normal in appearance, no lesions.  The vagina is normal in appearance.      The cervix is bulbous and everted at os, pap with HPV performed then IUD easily removed with froceps.  Uterus is felt to be normal size, shape, and contour.  No adnexal masses or tenderness noted. Extremities:  No swelling or varicosities noted Psych:  No mood changes,alert and cooperative,seems happy Discussed birth control options, will try POP,she does social smoke.  Impression: Well woman gyn exam with pap IUD removal  Contraceptive management    Plan: Rx Micronor 1 daily, start today, with 11 refills Use condoms Review handout on Micronor Physical in 1 year Mammogram at 74  Labs with PCP Void before bed, limit caffeine and  alcohol

## 2014-05-13 LAB — CYTOLOGY - PAP

## 2014-05-18 ENCOUNTER — Telehealth: Payer: Self-pay | Admitting: Adult Health

## 2014-05-18 NOTE — Telephone Encounter (Signed)
Pt aware of pap will repeat in 1 year, discussed with Dr Elonda Husky

## 2014-06-01 ENCOUNTER — Telehealth: Payer: Self-pay | Admitting: Adult Health

## 2014-06-01 MED ORDER — NORETHINDRONE 0.35 MG PO TABS: 1.0000 | ORAL_TABLET | Freq: Every day | ORAL | Status: DC

## 2014-06-01 NOTE — Telephone Encounter (Signed)
Pt is requesting mail order for birth control. She is pleased with the pill. Thanks!! Melissa Mullen

## 2014-06-01 NOTE — Telephone Encounter (Signed)
Will rx micronor for mail order

## 2014-06-16 ENCOUNTER — Other Ambulatory Visit: Payer: Self-pay | Admitting: *Deleted

## 2014-06-16 MED ORDER — EPINEPHRINE 0.3 MG/0.3ML IJ SOAJ: 0.3000 mg | Freq: Once | INTRAMUSCULAR | Status: DC

## 2014-06-16 MED ORDER — MONTELUKAST SODIUM 10 MG PO TABS: 10.0000 mg | ORAL_TABLET | Freq: Every day | ORAL | Status: DC

## 2014-08-06 ENCOUNTER — Encounter: Payer: Self-pay | Admitting: Family Medicine

## 2014-08-06 ENCOUNTER — Ambulatory Visit (INDEPENDENT_AMBULATORY_CARE_PROVIDER_SITE_OTHER): Payer: 59 | Admitting: Family Medicine

## 2014-08-06 VITALS — BP 98/66 | HR 84 | Temp 98.8°F | Resp 18 | Ht 59.0 in | Wt 136.0 lb

## 2014-08-06 DIAGNOSIS — Z0000 Encounter for general adult medical examination without abnormal findings: Secondary | ICD-10-CM | POA: Diagnosis not present

## 2014-08-06 LAB — LIPID PANEL
CHOLESTEROL: 159 mg/dL (ref 0–200)
HDL: 46.3 mg/dL (ref >39.00)
LDL CALC: 102 mg/dL — AB (ref 0–99)
NONHDL: 112.7
Total CHOL/HDL Ratio: 3
Triglycerides: 55 mg/dL (ref 0.0–149.0)
VLDL: 11 mg/dL (ref 0.0–40.0)

## 2014-08-06 LAB — TSH: TSH: 1.15 u[IU]/mL (ref 0.35–4.50)

## 2014-08-06 MED ORDER — CYCLOBENZAPRINE HCL 5 MG PO TABS: ORAL_TABLET | ORAL | Status: DC

## 2014-08-06 MED ORDER — FLUTICASONE PROPIONATE 50 MCG/ACT NA SUSP: NASAL | Status: DC

## 2014-08-06 MED ORDER — ALPRAZOLAM 0.25 MG PO TABS: ORAL_TABLET | ORAL | Status: DC

## 2014-08-06 MED ORDER — CETIRIZINE HCL 10 MG PO TABS: 10.0000 mg | ORAL_TABLET | Freq: Every day | ORAL | Status: DC

## 2014-08-06 NOTE — Progress Notes (Signed)
Office Note 08/06/2014  CC:  Chief Complaint  Patient presents with  . Annual Exam    fasting    HPI:  Melissa Mullen is a 39 y.o. White female who is here for annual fasting CPE. GYN care via [facility] in [place], UTD (05/11/14).  Pt doing well, no acute complaints. Getting q89mo dental care. No vision or hearing c/o.  Exercising very little, too busy.  Past Medical History  Diagnosis Date  . Hypothyroidism   . Migraine syndrome     very sporadic, triggers unknown  . Anxiety   . Depression   . Hay fever   . Nephrolithiasis 2012    No imaging has been done.  . Cancer 2001    thyroid  . History of thyroid cancer 11/14/2012  . IUD (intrauterine device) in place 11/14/2012    Put in 2011  . Encounter for IUD removal 05/11/2014    Past Surgical History  Procedure Laterality Date  . Thyroidectomy  2001    +Rad Iodine ablation  . Tonsillectomy and adenoidectomy  1980s  . Wisdom tooth extraction  1996    Family History  Problem Relation Age of Onset  . Hyperlipidemia Mother   . Diabetes Father   . Hypertension Father   . Hyperlipidemia Father   . Cancer Paternal Aunt     ovarian  . Cancer Paternal Uncle   . Diabetes Paternal Grandfather   . Hypertension Paternal Grandfather   . Stroke Paternal Grandfather     History   Social History  . Marital Status: Married    Spouse Name: N/A  . Number of Children: N/A  . Years of Education: N/A   Occupational History  . Not on file.   Social History Main Topics  . Smoking status: Former Smoker    Types: Cigarettes    Quit date: 04/10/2010  . Smokeless tobacco: Never Used  . Alcohol Use: Yes     Comment: occ  . Drug Use: No  . Sexual Activity: Yes    Birth Control/ Protection: Pill   Other Topics Concern  . Not on file   Social History Narrative   Married, mother of one son 19 y/o.   Stay at home mom.     No Tob, minimal alc.   Exercise: sporadically          Outpatient Prescriptions  Prior to Visit  Medication Sig Dispense Refill  . diphenhydrAMINE (BENADRYL) 25 MG tablet Take 50 mg by mouth once as needed for itching or allergies (TAKEN ONCE IN RESPONSE TO ALLERGIC REACTION).    Marland Kitchen EPINEPHrine 0.3 mg/0.3 mL IJ SOAJ injection Inject 0.3 mLs (0.3 mg total) into the muscle once. 1 Device 3  . levothyroxine (SYNTHROID) 112 MCG tablet Take 1 tablet (112 mcg total) by mouth daily. 30 tablet 0  . montelukast (SINGULAIR) 10 MG tablet Take 1 tablet (10 mg total) by mouth at bedtime. 90 tablet 1  . norethindrone (MICRONOR,CAMILA,ERRIN) 0.35 MG tablet Take 1 tablet (0.35 mg total) by mouth daily. 3 Package 3  . ALPRAZolam (XANAX) 0.25 MG tablet TAKE ONE TABLET TWICE DAILY AS NEEDED FOR ANXIETY 30 tablet 3  . cetirizine (ZYRTEC) 10 MG tablet Take 10 mg by mouth daily.    . cyclobenzaprine (FLEXERIL) 5 MG tablet 1 tab po tid prn TMJ pain 30 tablet 1  . fluticasone (FLONASE) 50 MCG/ACT nasal spray 2 sprays each nostril qAM 16 g 1   No facility-administered medications prior to visit.  Allergies  Allergen Reactions  . Bee Venom Itching and Swelling  . Codeine Nausea Only    ROS Review of Systems  Constitutional: Negative for fever, chills, appetite change and fatigue.  HENT: Negative for congestion, dental problem, ear pain and sore throat.   Eyes: Negative for discharge, redness and visual disturbance.  Respiratory: Negative for cough, chest tightness, shortness of breath and wheezing.   Cardiovascular: Negative for chest pain, palpitations and leg swelling.  Gastrointestinal: Negative for nausea, vomiting, abdominal pain, diarrhea and blood in stool.  Genitourinary: Negative for dysuria, urgency, frequency, hematuria, flank pain and difficulty urinating.  Musculoskeletal: Negative for myalgias, back pain, joint swelling, arthralgias and neck stiffness.  Skin: Negative for pallor and rash.  Neurological: Negative for dizziness, speech difficulty, weakness and headaches.   Hematological: Negative for adenopathy. Does not bruise/bleed easily.  Psychiatric/Behavioral: Negative for confusion and sleep disturbance. The patient is not nervous/anxious.     PE; Blood pressure 98/66, pulse 84, temperature 98.8 F (37.1 C), temperature source Temporal, resp. rate 18, height 4\' 11"  (1.499 m), weight 136 lb (61.689 kg), SpO2 100 %.  Pt examined with Jacklynn Ganong, CMA, as chaperone. Gen: Alert, well appearing.  Patient is oriented to person, place, time, and situation. AFFECT: pleasant, lucid thought and speech. ENT: Ears: EACs clear, normal epithelium.  TMs with good light reflex and landmarks bilaterally.  Eyes: no injection, icteris, swelling, or exudate.  EOMI, PERRLA. Nose: no drainage or turbinate edema/swelling.  No injection or focal lesion.  Mouth: lips without lesion/swelling.  Oral mucosa pink and moist.  Dentition intact and without obvious caries or gingival swelling.  Oropharynx without erythema, exudate, or swelling.  Neck: supple/nontender.  No LAD, mass, or TM.  Carotid pulses 2+ bilaterally, without bruits. CV: RRR, no m/r/g.   LUNGS: CTA bilat, nonlabored resps, good aeration in all lung fields. ABD: soft, NT, ND, BS normal.  No hepatospenomegaly or mass.  No bruits. EXT: no clubbing, cyanosis, or edema.  Musculoskeletal: no joint swelling, erythema, warmth, or tenderness.  ROM of all joints intact. Skin - no sores or suspicious lesions or rashes or color changes   Pertinent labs:  Lab Results  Component Value Date   TSH 1.01 02/03/2014   Lab Results  Component Value Date   WBC 6.4 02/03/2014   HGB 14.2 02/03/2014   HCT 43.3 02/03/2014   MCV 90.1 02/03/2014   PLT 335.0 02/03/2014   Lab Results  Component Value Date   CREATININE 0.8 02/03/2014   BUN 14 02/03/2014   NA 137 02/03/2014   K 4.3 02/03/2014   CL 105 02/03/2014   CO2 25 02/03/2014   Lab Results  Component Value Date   ALT 11 02/03/2014   AST 17 02/03/2014   ALKPHOS 48  02/03/2014   BILITOT 0.7 02/03/2014   Lab Results  Component Value Date   CHOL 183 07/31/2012   Lab Results  Component Value Date   HDL 46.80 07/31/2012   Lab Results  Component Value Date   LDLCALC 121* 07/31/2012   Lab Results  Component Value Date   TRIG 75.0 07/31/2012   Lab Results  Component Value Date   CHOLHDL 4 07/31/2012   ASSESSMENT AND PLAN:   Health maintenance examination Reviewed age and gender appropriate health maintenance issues (prudent diet, regular exercise, health risks of tobacco and excessive alcohol, use of seatbelts, fire alarms in home, use of sunscreen).  Also reviewed age and gender appropriate health screening as well as vaccine recommendations.  Check FLP and TSH today. Due to hx of thyroid ca, goal TSH is between 0 and 1. Cervical ca screening UTD via GYN. Mammo starting age 18. Pt declined HIV screening today. Vaccines UTD. MED rf's done today.   An After Visit Summary was printed and given to the patient.  FOLLOW UP:  Return in about 6 months (around 02/05/2015) for routine chronic illness f/u.

## 2014-08-06 NOTE — Assessment & Plan Note (Signed)
Reviewed age and gender appropriate health maintenance issues (prudent diet, regular exercise, health risks of tobacco and excessive alcohol, use of seatbelts, fire alarms in home, use of sunscreen).  Also reviewed age and gender appropriate health screening as well as vaccine recommendations. Check FLP and TSH today. Due to hx of thyroid ca, goal TSH is between 0 and 1. Cervical ca screening UTD via GYN. Mammo starting age 39. Pt declined HIV screening today. Vaccines UTD. MED rf's done today.

## 2014-08-06 NOTE — Progress Notes (Signed)
Pre visit review using our clinic review tool, if applicable. No additional management support is needed unless otherwise documented below in the visit note. 

## 2014-11-19 ENCOUNTER — Other Ambulatory Visit: Payer: Self-pay | Admitting: *Deleted

## 2014-11-19 MED ORDER — LEVOTHYROXINE SODIUM 112 MCG PO TABS: 112.0000 ug | ORAL_TABLET | Freq: Every day | ORAL | Status: DC

## 2014-11-19 NOTE — Telephone Encounter (Signed)
RF request for synthroid LOV: 08/06/14 Next ov: None Last written: 07/31/13 #30 w/ 0RF

## 2015-01-21 ENCOUNTER — Other Ambulatory Visit: Payer: Self-pay | Admitting: Family Medicine

## 2015-01-21 NOTE — Telephone Encounter (Signed)
RF request for montelukast LOV: 08/06/14 Next ov: 02/03/15 Last written: 06/16/14 #90 w/ 1RF

## 2015-02-03 ENCOUNTER — Ambulatory Visit (INDEPENDENT_AMBULATORY_CARE_PROVIDER_SITE_OTHER): Payer: 59 | Admitting: Family Medicine

## 2015-02-03 ENCOUNTER — Encounter: Payer: Self-pay | Admitting: Family Medicine

## 2015-02-03 VITALS — BP 114/73 | HR 80 | Temp 98.7°F | Resp 16 | Ht 59.0 in | Wt 133.0 lb

## 2015-02-03 DIAGNOSIS — E890 Postprocedural hypothyroidism: Secondary | ICD-10-CM | POA: Diagnosis not present

## 2015-02-03 DIAGNOSIS — R5382 Chronic fatigue, unspecified: Secondary | ICD-10-CM

## 2015-02-03 LAB — TSH: TSH: 0.48 u[IU]/mL (ref 0.35–4.50)

## 2015-02-03 NOTE — Progress Notes (Signed)
Pre visit review using our clinic review tool, if applicable. No additional management support is needed unless otherwise documented below in the visit note. 

## 2015-02-03 NOTE — Progress Notes (Signed)
OFFICE VISIT  02/07/2015   CC:  Chief Complaint  Patient presents with  . Follow-up    Pt is fasting.     HPI:    Patient is a 39 y.o. Caucasian female who presents for 6 mo f/u hypothyroidism (hx of thyroid cancer).   Still mainly struggling with fatigue, worse as the day goes on, even to the point of mental fogginess and some dysarthria in evenings, + lids droop.  Question of some blurry vision towards end of day but she blames this on contacts.  Denies distinct diplopia. No snoring or apneic events witnessed.  Feels rested in mornings but just gets exhausted throughout her day. No hair loss, no skin changes, no cold intolerance, denies depression.  No fevers, no oral ulcers, no odd rashes, no abnl wt loss or gain.  Past Medical History  Diagnosis Date  . Hypothyroidism   . Migraine syndrome     very sporadic, triggers unknown  . Anxiety   . Depression   . Hay fever   . Nephrolithiasis 2012    No imaging has been done.  . Cancer ([place]) 2001    thyroid  . History of thyroid cancer 11/14/2012  . IUD (intrauterine device) in place 11/14/2012    Put in 2011  . Encounter for IUD removal 05/11/2014    Past Surgical History  Procedure Laterality Date  . Thyroidectomy  2001    +Rad Iodine ablation  . Tonsillectomy and adenoidectomy  1980s  . Wisdom tooth extraction  1996    Outpatient Prescriptions Prior to Visit  Medication Sig Dispense Refill  . ALPRAZolam (XANAX) 0.25 MG tablet TAKE ONE TABLET TWICE DAILY AS NEEDED FOR ANXIETY 30 tablet 3  . cetirizine (ZYRTEC) 10 MG tablet Take 1 tablet (10 mg total) by mouth daily. 30 tablet 11  . cyclobenzaprine (FLEXERIL) 5 MG tablet 1 tab po tid prn TMJ pain 30 tablet 3  . diphenhydrAMINE (BENADRYL) 25 MG tablet Take 50 mg by mouth once as needed for itching or allergies (TAKEN ONCE IN RESPONSE TO ALLERGIC REACTION).    Marland Kitchen EPINEPHrine 0.3 mg/0.3 mL IJ SOAJ injection Inject 0.3 mLs (0.3 mg total) into the muscle once. 1 Device 3  .  fluticasone (FLONASE) 50 MCG/ACT nasal spray 2 sprays each nostril qAM 16 g 11  . montelukast (SINGULAIR) 10 MG tablet TAKE 1 TABLET AT BEDTIME 90 tablet 3  . levothyroxine (SYNTHROID) 112 MCG tablet Take 1 tablet (112 mcg total) by mouth daily. 90 tablet 3  . norethindrone (MICRONOR,CAMILA,ERRIN) 0.35 MG tablet Take 1 tablet (0.35 mg total) by mouth daily. (Patient not taking: Reported on 02/03/2015) 3 Package 3   No facility-administered medications prior to visit.    Allergies  Allergen Reactions  . Bee Venom Itching and Swelling  . Codeine Nausea Only    ROS As per HPI  PE: Blood pressure 114/73, pulse 80, temperature 98.7 F (37.1 C), temperature source Oral, resp. rate 16, height 4\' 11"  (1.499 m), weight 133 lb (60.328 kg), last menstrual period 01/31/2015, SpO2 100 %. Wt Readings from Last 2 Encounters:  02/03/15 133 lb (60.328 kg)  08/06/14 136 lb (61.689 kg)   Gen: alert, oriented x 4, affect pleasant.  Lucid thinking and conversation noted. HEENT: PERRLA, EOMI.  No ptosis.  Speech is clear. Neck: no LAD, mass, or thyromegaly. CV: RRR, no m/r/g LUNGS: CTA bilat, nonlabored. NEURO: no tremor or tics noted on observation.  Coordination intact. CN 2-12 grossly intact bilaterally, strength 5/5 in  all extremeties.  No ataxia.   LABS:  TSH 08/06/14 was 1.15--no change in thyroid med dosing.  IMPRESSION AND PLAN:  1) Postsurgical hypothyroidism in pt with hx of thyroid cancer 15 years ago. Recheck TSH today.  Goal TSH between 0.2 and 1.  2) Chronic fatigue, some sx's mildly suggestive of possible myasthenia gravis. Will check ACh R ab's.  An After Visit Summary was printed and given to the patient.  FOLLOW UP: Return in about 6 months (around 08/04/2015) for routine chronic illness f/u.

## 2015-02-04 ENCOUNTER — Ambulatory Visit: Payer: 59 | Admitting: Family Medicine

## 2015-02-04 LAB — ACETYLCHOLINE RECEPTOR, BLOCKING: ACHR Blocking Abs: <15 % inhibit (ref <15)

## 2015-02-05 ENCOUNTER — Other Ambulatory Visit: Payer: Self-pay | Admitting: *Deleted

## 2015-02-05 MED ORDER — LEVOTHYROXINE SODIUM 112 MCG PO TABS: 112.0000 ug | ORAL_TABLET | Freq: Every day | ORAL | Status: DC

## 2015-02-07 ENCOUNTER — Encounter: Payer: Self-pay | Admitting: Family Medicine

## 2015-02-16 ENCOUNTER — Ambulatory Visit (INDEPENDENT_AMBULATORY_CARE_PROVIDER_SITE_OTHER): Payer: 59

## 2015-02-16 DIAGNOSIS — Z23 Encounter for immunization: Secondary | ICD-10-CM | POA: Diagnosis not present

## 2015-04-21 ENCOUNTER — Other Ambulatory Visit: Payer: Self-pay | Admitting: Family Medicine

## 2015-04-21 NOTE — Telephone Encounter (Signed)
Rx faxed

## 2015-04-21 NOTE — Telephone Encounter (Signed)
RF request for alprazolam LOV: 02/03/15 Next ov: 08/04/15 Last written: 08/06/14 #30 w/ 3RF  Please advise. Thanks.

## 2015-08-04 ENCOUNTER — Ambulatory Visit: Payer: 59 | Admitting: Family Medicine

## 2015-08-23 ENCOUNTER — Ambulatory Visit: Payer: 59 | Admitting: Family Medicine

## 2015-09-28 ENCOUNTER — Ambulatory Visit (INDEPENDENT_AMBULATORY_CARE_PROVIDER_SITE_OTHER): Payer: 59 | Admitting: Family Medicine

## 2015-09-28 ENCOUNTER — Encounter: Payer: Self-pay | Admitting: Family Medicine

## 2015-09-28 VITALS — BP 92/64 | HR 77 | Temp 97.9°F | Resp 16 | Ht 59.0 in | Wt 127.0 lb

## 2015-09-28 DIAGNOSIS — E890 Postprocedural hypothyroidism: Secondary | ICD-10-CM | POA: Diagnosis not present

## 2015-09-28 DIAGNOSIS — F411 Generalized anxiety disorder: Secondary | ICD-10-CM | POA: Diagnosis not present

## 2015-09-28 DIAGNOSIS — M25512 Pain in left shoulder: Secondary | ICD-10-CM

## 2015-09-28 LAB — TSH: TSH: 1.04 u[IU]/mL (ref 0.35–4.50)

## 2015-09-28 MED ORDER — CYCLOBENZAPRINE HCL 5 MG PO TABS: ORAL_TABLET | ORAL | Status: DC

## 2015-09-28 MED ORDER — MONTELUKAST SODIUM 10 MG PO TABS: 10.0000 mg | ORAL_TABLET | Freq: Every day | ORAL | Status: DC

## 2015-09-28 MED ORDER — ALPRAZOLAM 0.25 MG PO TABS: ORAL_TABLET | ORAL | Status: DC

## 2015-09-28 NOTE — Progress Notes (Signed)
Pre visit review using our clinic review tool, if applicable. No additional management support is needed unless otherwise documented below in the visit note. 

## 2015-09-28 NOTE — Progress Notes (Signed)
OFFICE VISIT  09/28/2015   CC:  Chief Complaint  Patient presents with  . Follow-up    Pt is not fasting.    HPI:    Patient is a 40 y.o. Caucasian female who presents for f/u hypothyroidism and generalized anxiety. Compliant with thyroid med and takes it correctly.  For anxiety, she requires rare alprazolam use (couple times a month)--sometimes for anticipatory anxiety reasons and sometimes for help decreasing intense anxiety around bedtime.  Flexeril use is sporadic : for TMJ, low back, and left shoulder.  Left shoulder pain seems to be getting worse. Describes RC movements that make it "tweek" with pain and she "deals with it" and it passes.  This comes more with awakening in the morning b/c of sleep position and sometimes driving.  She takes no anti-inflammatory.   Past Medical History  Diagnosis Date  . Postsurgical hypothyroidism   . Migraine syndrome     very sporadic, triggers unknown  . Anxiety   . Depression   . Hay fever   . Nephrolithiasis 2012    No imaging has been done.  Marland Kitchen History of thyroid cancer 2001  . IUD (intrauterine device) in place 11/14/2012    Put in 2011  . Encounter for IUD removal 05/11/2014    Past Surgical History  Procedure Laterality Date  . Thyroidectomy  2001    +Rad Iodine ablation  . Tonsillectomy and adenoidectomy  1980s  . Wisdom tooth extraction  1996    Outpatient Prescriptions Prior to Visit  Medication Sig Dispense Refill  . cetirizine (ZYRTEC) 10 MG tablet Take 1 tablet (10 mg total) by mouth daily. 30 tablet 11  . diphenhydrAMINE (BENADRYL) 25 MG tablet Take 50 mg by mouth once as needed for itching or allergies (TAKEN ONCE IN RESPONSE TO ALLERGIC REACTION).    Marland Kitchen EPINEPHrine 0.3 mg/0.3 mL IJ SOAJ injection Inject 0.3 mLs (0.3 mg total) into the muscle once. 1 Device 3  . levothyroxine (SYNTHROID) 112 MCG tablet Take 1 tablet (112 mcg total) by mouth daily. 90 tablet 3  . ALPRAZolam (XANAX) 0.25 MG tablet TAKE ONE TABLET TWICE  DAILY AS NEEDED FOR ANXIETY 30 tablet 3  . cyclobenzaprine (FLEXERIL) 5 MG tablet 1 tab po tid prn TMJ pain 30 tablet 3  . fluticasone (FLONASE) 50 MCG/ACT nasal spray 2 sprays each nostril qAM 16 g 11  . montelukast (SINGULAIR) 10 MG tablet TAKE 1 TABLET AT BEDTIME 90 tablet 3   No facility-administered medications prior to visit.    Allergies  Allergen Reactions  . Bee Venom Itching and Swelling  . Codeine Nausea Only    ROS As per HPI  PE: Blood pressure 92/64, pulse 77, temperature 97.9 F (36.6 C), temperature source Oral, resp. rate 16, height 4\' 11"  (1.499 m), weight 127 lb (57.607 kg), last menstrual period 08/31/2015, SpO2 100 %. Gen: Alert, well appearing.  Patient is oriented to person, place, time, and situation. [id number]: no injection, icteris, swelling, or exudate.  EOMI, PERRLA. Mouth: lips without lesion/swelling.  Oral mucosa pink and moist. Oropharynx without erythema, exudate, or swelling.  Neck - No masses or thyromegaly or limitation in range of motion CV: RRR, no m/r/g.   LUNGS: CTA bilat, nonlabored resps, good aeration in all lung fields. EXT: no clubbing, cyanosis, or edema.  L shoulder: TTP around acromion hook and minimally over AC joint. ROM intact but pain with resisted aBduction, IR, and ER. UE strength 5/5 prox/dist bilat.  LABS:  Lab Results  Component  Value Date   TSH 0.48 02/03/2015   Lab Results  Component Value Date   WBC 6.4 02/03/2014   HGB 14.2 02/03/2014   HCT 43.3 02/03/2014   MCV 90.1 02/03/2014   PLT 335.0 02/03/2014   Lab Results  Component Value Date   CREATININE 0.8 02/03/2014   BUN 14 02/03/2014   NA 137 02/03/2014   K 4.3 02/03/2014   CL 105 02/03/2014   CO2 25 02/03/2014   Lab Results  Component Value Date   ALT 11 02/03/2014   AST 17 02/03/2014   ALKPHOS 48 02/03/2014   BILITOT 0.7 02/03/2014   Lab Results  Component Value Date   CHOL 159 08/06/2014   Lab Results  Component Value Date   HDL 46.30  08/06/2014   Lab Results  Component Value Date   LDLCALC 102* 08/06/2014   Lab Results  Component Value Date   TRIG 55.0 08/06/2014   Lab Results  Component Value Date   CHOLHDL 3 08/06/2014   IMPRESSION AND PLAN:  1) Hypothyroidism: Postsurgical in pt with hx of thyroid cancer 15 yrs ago.  Recheck TSH today. GOal TSH between 0.2 and 1.0.  2) Anxiety: stable.  Appropriate/limited use of xanax.  New xanax rx printed and handed to pt today.  3) Left shoulder pain: RC tendonitis, waxing/waning.  She is not ready for any treatment yet, but we discussed first step of NSAID bid x 10-14d, and if still bothering her a lot after this then injection of steroid into joint is next step. She expressed understanding.  An After Visit Summary was printed and given to the patient.  FOLLOW UP: Return in about 6 months (around 03/29/2016) for annual CPE (fasting).  Signed:  Crissie Sickles, MD           09/28/2015

## 2015-09-29 ENCOUNTER — Telehealth: Payer: Self-pay | Admitting: *Deleted

## 2015-09-29 ENCOUNTER — Other Ambulatory Visit: Payer: Self-pay | Admitting: Family Medicine

## 2015-09-29 MED ORDER — LEVOTHYROXINE SODIUM 112 MCG PO TABS: 112.0000 ug | ORAL_TABLET | Freq: Every day | ORAL | Status: DC

## 2015-09-29 NOTE — Telephone Encounter (Signed)
Received PA request from pharmacy for Brand Synthroid. I reviewed pts chart and can not find documentation as to why she needs Brand only. Left message for pt to call to see if she could advise.

## 2015-10-05 MED ORDER — LEVOTHYROXINE SODIUM 112 MCG PO TABS: 112.0000 ug | ORAL_TABLET | Freq: Every day | ORAL | Status: DC

## 2015-10-05 NOTE — Telephone Encounter (Signed)
Spoke to pt, she stated that she was advised by her endocrinologist years ago to stay on the brand Synthroid since she has her thyroid removed due to thyroid cancer. She stated that she has never tried the levothyroxine and is will to try it if need be. She stated that she gets her Synthroid throught [facility] and they will fill it for the brand Synthroid and only charge her the the cost of the generic. She stated that the Rx can not have on it Brand only. I advised her that the last Rx was sent to [place] so I will resend it to [facility] to see if a PA is needed. Rx has been sent to [facility] and will wait for response.

## 2016-01-08 ENCOUNTER — Other Ambulatory Visit: Payer: Self-pay | Admitting: Family Medicine

## 2016-02-18 ENCOUNTER — Ambulatory Visit (INDEPENDENT_AMBULATORY_CARE_PROVIDER_SITE_OTHER): Payer: 59 | Admitting: Family Medicine

## 2016-02-18 ENCOUNTER — Encounter: Payer: Self-pay | Admitting: Family Medicine

## 2016-02-18 VITALS — BP 103/66 | HR 78 | Temp 98.2°F | Resp 16 | Wt 128.4 lb

## 2016-02-18 DIAGNOSIS — E890 Postprocedural hypothyroidism: Secondary | ICD-10-CM | POA: Diagnosis not present

## 2016-02-18 DIAGNOSIS — R5382 Chronic fatigue, unspecified: Secondary | ICD-10-CM

## 2016-02-18 DIAGNOSIS — G9332 Myalgic encephalomyelitis/chronic fatigue syndrome: Secondary | ICD-10-CM

## 2016-02-18 DIAGNOSIS — L989 Disorder of the skin and subcutaneous tissue, unspecified: Secondary | ICD-10-CM | POA: Diagnosis not present

## 2016-02-18 LAB — TSH: TSH: 1.68 u[IU]/mL (ref 0.35–4.50)

## 2016-02-18 NOTE — Progress Notes (Signed)
OFFICE VISIT  02/18/2016   CC:  Chief Complaint  Patient presents with  . Follow-up    Hypothyroidism   HPI:    Patient is a 40 y.o. Caucasian female who presents for f/u hypothyroidism, anxiety, allergic rhinitis. Still struggling with chronic fatigue and "brain fog".  We have agreed in the past that she likely has chronic fatigue syndrome.  She has some problems over the last 6 mo or so with intermittent bilat knee and bilat hip pains.  No swelling or redness or warmth of any joints.   Uses heating pad and this helps.  Rarely takes tylenol or NSAID.  Taking synthroid daily, correctly.  Has a couple of pink spots on face last month or more.  No itching or pain.  Past Medical History:  Diagnosis Date  . Anxiety   . Depression   . Encounter for IUD removal 05/11/2014  . Hay fever    singulair and zyrtec are helpful  . History of thyroid cancer 2001  . IUD (intrauterine device) in place 11/14/2012   Put in 2011  . Migraine syndrome    very sporadic, triggers unknown  . Nephrolithiasis 2012   No imaging has been done.  Marland Kitchen Postsurgical hypothyroidism   . TMJ arthralgia    flexeril prn is helpful    Past Surgical History:  Procedure Laterality Date  . THYROIDECTOMY  2001   +Rad Iodine ablation  . TONSILLECTOMY AND ADENOIDECTOMY  1980s  . [place]    Outpatient Medications Prior to Visit  Medication Sig Dispense Refill  . ALPRAZolam (XANAX) 0.25 MG tablet TAKE ONE TABLET TWICE DAILY AS NEEDED FOR ANXIETY 30 tablet 3  . cetirizine (ZYRTEC) 10 MG tablet Take 1 tablet (10 mg total) by mouth daily. 30 tablet 11  . cyclobenzaprine (FLEXERIL) 5 MG tablet 1 tab po tid prn TMJ pain 30 tablet 3  . diphenhydrAMINE (BENADRYL) 25 MG tablet Take 50 mg by mouth once as needed for itching or allergies (TAKEN ONCE IN RESPONSE TO ALLERGIC REACTION).    Marland Kitchen EPINEPHrine 0.3 mg/0.3 mL IJ SOAJ injection Inject 0.3 mLs (0.3 mg total) into the muscle once. 1 Device 3  .  levothyroxine (SYNTHROID) 112 MCG tablet Take 1 tablet (112 mcg total) by mouth daily. 90 tablet 3  . montelukast (SINGULAIR) 10 MG tablet Take 1 tablet (10 mg total) by mouth at bedtime. 90 tablet 3  . montelukast (SINGULAIR) 10 MG tablet TAKE 1 TABLET AT BEDTIME 90 tablet 3   No facility-administered medications prior to visit.     Allergies  Allergen Reactions  . Bee Venom Itching and Swelling  . Codeine Nausea Only    ROS As per HPI  PE: Blood pressure 103/66, pulse 78, temperature 98.2 F (36.8 C), temperature source Temporal, resp. rate 16, weight 128 lb 6.4 oz (58.2 kg), last menstrual period 02/10/2016, SpO2 100 %. Gen: Alert, well appearing.  Patient is oriented to person, place, time, and situation. AFFECT: pleasant, lucid thought and speech. Neck - No masses or thyromegaly or limitation in range of motion CV: RRR, no m/r/g.   LUNGS: CTA bilat, nonlabored resps, good aeration in all lung fields. Face: Just to the right of her nose she has a small telangectasia--approx 2-3 mm, light pink. Has a punctate pink papule on each cheek without any telangectasia associated.  LABS:  Lab Results  Component Value Date   TSH 1.04 09/28/2015   Lab Results  Component Value Date   WBC  6.4 02/03/2014   HGB 14.2 02/03/2014   HCT 43.3 02/03/2014   MCV 90.1 02/03/2014   PLT 335.0 02/03/2014   Lab Results  Component Value Date   CREATININE 0.8 02/03/2014   BUN 14 02/03/2014   NA 137 02/03/2014   K 4.3 02/03/2014   CL 105 02/03/2014   CO2 25 02/03/2014   Lab Results  Component Value Date   ALT 11 02/03/2014   AST 17 02/03/2014   ALKPHOS 48 02/03/2014   BILITOT 0.7 02/03/2014   Lab Results  Component Value Date   CHOL 159 08/06/2014   Lab Results  Component Value Date   HDL 46.30 08/06/2014   Lab Results  Component Value Date   LDLCALC 102 (H) 08/06/2014   Lab Results  Component Value Date   TRIG 55.0 08/06/2014   Lab Results  Component Value Date    CHOLHDL 3 08/06/2014    IMPRESSION AND PLAN:  1) Hypothyroidism, postsurgical (remote hx of thyroid cancer): this has been stable. Recheck TSH today.  2) Chronic fatigue syndrome: encouraged pt to exercise more as the primary treatment for this condition. She may be developing some sx's consistent with fibromyalgia--nothing too concerning at this time.  3) Face lesions: I reassured her at this time as I think these lesions are benign and there is nothing to be done about them at this time.  Watchful waiting approach.  Pt agreeable.  An After Visit Summary was printed and given to the patient.  FOLLOW UP: Return in about 6 months (around 08/17/2016) for annual CPE (fasting).  Signed:  Crissie Sickles, MD           02/18/2016

## 2016-02-18 NOTE — Progress Notes (Signed)
Pre visit review using our clinic review tool, if applicable. No additional management support is needed unless otherwise documented below in the visit note. 

## 2016-04-20 ENCOUNTER — Other Ambulatory Visit: Payer: Self-pay | Admitting: Family Medicine

## 2016-06-13 ENCOUNTER — Other Ambulatory Visit: Payer: Self-pay | Admitting: Family Medicine

## 2016-06-13 NOTE — Telephone Encounter (Signed)
Rx faxed

## 2016-06-13 NOTE — Telephone Encounter (Signed)
[place].  RF request for alprazolam LOV: 02/18/16 Next ov: None Last written: 09/28/15 #30 w/ 3RF  Please advise. Thanks.

## 2016-10-16 ENCOUNTER — Ambulatory Visit (INDEPENDENT_AMBULATORY_CARE_PROVIDER_SITE_OTHER): Payer: BLUE CROSS/BLUE SHIELD | Admitting: Adult Health

## 2016-10-16 ENCOUNTER — Encounter: Payer: Self-pay | Admitting: Adult Health

## 2016-10-16 ENCOUNTER — Other Ambulatory Visit (HOSPITAL_COMMUNITY)
Admission: RE | Admit: 2016-10-16 | Discharge: 2016-10-16 | Disposition: A | Discharge status: Home / Self Care | Payer: BLUE CROSS/BLUE SHIELD | Source: Ambulatory Visit | Attending: Adult Health | Admitting: Adult Health

## 2016-10-16 VITALS — BP 104/60 | HR 90 | Ht 60.0 in | Wt 131.0 lb

## 2016-10-16 DIAGNOSIS — Z01419 Encounter for gynecological examination (general) (routine) without abnormal findings: Secondary | ICD-10-CM | POA: Insufficient documentation

## 2016-10-16 DIAGNOSIS — R1032 Left lower quadrant pain: Secondary | ICD-10-CM

## 2016-10-16 DIAGNOSIS — R5383 Other fatigue: Secondary | ICD-10-CM

## 2016-10-16 DIAGNOSIS — K6289 Other specified diseases of anus and rectum: Secondary | ICD-10-CM | POA: Insufficient documentation

## 2016-10-16 DIAGNOSIS — Z1211 Encounter for screening for malignant neoplasm of colon: Secondary | ICD-10-CM

## 2016-10-16 DIAGNOSIS — N946 Dysmenorrhea, unspecified: Secondary | ICD-10-CM | POA: Insufficient documentation

## 2016-10-16 DIAGNOSIS — N898 Other specified noninflammatory disorders of vagina: Secondary | ICD-10-CM | POA: Insufficient documentation

## 2016-10-16 DIAGNOSIS — Z1212 Encounter for screening for malignant neoplasm of rectum: Secondary | ICD-10-CM | POA: Diagnosis not present

## 2016-10-16 LAB — HEMOCCULT GUIAC POC 1CARD (OFFICE): Fecal Occult Blood, POC: NEGATIVE

## 2016-10-16 MED ORDER — FLUCONAZOLE 150 MG PO TABS: ORAL_TABLET | ORAL | 6 refills | Status: DC

## 2016-10-16 NOTE — Progress Notes (Signed)
Patient ID: Melissa Mullen, female   DOB: 10-01-1975, 41 y.o.   MRN: [id number] History of Present Illness:  Melissa Mullen is a 41 year old white female, married, G1P1, in for a well woman gyn exam and pap.Last pap was 05/11/2014, negative HPV, + atypical glandular cells.She is complaining of being more fatigue around periods and periods spotty and heavy for 2 days, increased periods cramps and itching before period and after and rectal pain with period. Pain in ovary area esp left side. Still spotting from period.  PCP is Dr Ernestine Conrad.   Current Medications, Allergies, Past Medical History, Past Surgical History, Family History and Social History were reviewed in [employer] record.     Review of Systems: Patient denies any headaches, hearing loss, blurred vision, shortness of breath, chest pain, abdominal pain, problems with bowel movements, urination, or intercourse. No joint pain or mood swings.See HPI for positives.    Physical Exam:BP 104/60 (BP Location: Right Arm, Patient Position: Sitting, Cuff Size: Normal)   Pulse 90   Ht 5' (1.524 m)   Wt 131 lb (59.4 kg)   LMP 10/14/2016 (Exact Date)   BMI 25.58 kg/m  General:  Well developed, well nourished, no acute distress Skin:  Warm and dry Neck:  Midline trachea, thyroid surgically absent, good ROM, no lymphadenopathy Lungs; Clear to auscultation bilaterally Breast:  No dominant palpable mass, retraction, or nipple discharge Cardiovascular: Regular rate and rhythm Abdomen:  Soft, non tender, no hepatosplenomegaly Pelvic:  External genitalia is normal in appearance, no lesions.  The vagina is normal in appearance. Urethra has no lesions or masses. The cervix is bulbous,everted at os, pap with HPV performed.  Uterus is felt to be normal size, shape, and contour. Mildly tender. No adnexal masses.LLQ tenderness noted.Bladder is non tender, no masses felt. Rectal: Good sphincter tone, no polyps, or hemorrhoids felt.   Hemoccult negative. Extremities/musculoskeletal:  No swelling or varicosities noted, no clubbing or cyanosis Psych:  No mood changes, alert and cooperative,seems happy PHQ 2 score 0. Will rx diflucan to see if helps with itching and will get [place] to assess uterus and ovaries.  Impression: 1. Encounter for gynecological examination with Papanicolaou smear of cervix   2. Menstrual cramps   3. Vaginal itching   4. Rectal pain   5. Fatigue, unspecified type   6. LLQ pain   7. Screening for colorectal cancer       Plan: Meds ordered this encounter  Medications  . fluconazole (DIFLUCAN) 150 MG tablet    Sig: Take1  every month before period    Dispense:  1 tablet    Refill:  6    Order Specific Question:   Supervising Provider    Answer:   Tania Ade H [[id number]]  Return in 2 days for GYN [place] to assess Physical in 1 year, pap in 3 if normal Mammogram now and yearly Labs with PCP

## 2016-10-17 LAB — CYTOLOGY - PAP
Diagnosis: NEGATIVE
HPV (WINDOPATH): NOT DETECTED

## 2016-10-18 ENCOUNTER — Ambulatory Visit (INDEPENDENT_AMBULATORY_CARE_PROVIDER_SITE_OTHER): Payer: BLUE CROSS/BLUE SHIELD

## 2016-10-18 DIAGNOSIS — N946 Dysmenorrhea, unspecified: Secondary | ICD-10-CM

## 2016-10-18 DIAGNOSIS — R1032 Left lower quadrant pain: Secondary | ICD-10-CM

## 2016-10-18 DIAGNOSIS — K6289 Other specified diseases of anus and rectum: Secondary | ICD-10-CM | POA: Diagnosis not present

## 2016-10-18 NOTE — Progress Notes (Signed)
PELVIC US TA/TV: heterogeneous anteverted uterus w/mult.fibroids,(#1) anterior right subserosal fibroid 3.2 x 2.3 x 3.4 cm,(#2)intramural fibroid mid uterus 2.2 x 2.4 x 1.3 cm,EEC 8.3 mm,normal ovaries bilat,ovaries appear mobile,no free fluid,left adnexal pain during ultrasound

## 2016-10-19 ENCOUNTER — Encounter: Payer: Self-pay | Admitting: Family Medicine

## 2016-10-19 ENCOUNTER — Ambulatory Visit (INDEPENDENT_AMBULATORY_CARE_PROVIDER_SITE_OTHER): Payer: BLUE CROSS/BLUE SHIELD | Admitting: Family Medicine

## 2016-10-19 VITALS — BP 94/64 | HR 88 | Temp 97.9°F | Resp 16 | Ht 60.0 in | Wt 129.5 lb

## 2016-10-19 DIAGNOSIS — E890 Postprocedural hypothyroidism: Secondary | ICD-10-CM

## 2016-10-19 DIAGNOSIS — Z0000 Encounter for general adult medical examination without abnormal findings: Secondary | ICD-10-CM

## 2016-10-19 LAB — CBC WITH DIFFERENTIAL/PLATELET
BASOS ABS: 0.1 10*3/uL (ref 0.0–0.1)
BASOS PCT: 1.2 % (ref 0.0–3.0)
EOS ABS: 0.2 10*3/uL (ref 0.0–0.7)
Eosinophils Relative: 2.8 % (ref 0.0–5.0)
HCT: 42.1 % (ref 36.0–46.0)
HEMOGLOBIN: 14.2 g/dL (ref 12.0–15.0)
Lymphocytes Relative: 29.6 % (ref 12.0–46.0)
Lymphs Abs: 1.9 10*3/uL (ref 0.7–4.0)
MCHC: 33.8 g/dL (ref 30.0–36.0)
MCV: 90.9 fl (ref 78.0–100.0)
MONOS PCT: 9.1 % (ref 3.0–12.0)
Monocytes Absolute: 0.6 10*3/uL (ref 0.1–1.0)
Neutro Abs: 3.8 10*3/uL (ref 1.4–7.7)
Neutrophils Relative %: 57.3 % (ref 43.0–77.0)
Platelets: 369 10*3/uL (ref 150.0–400.0)
RBC: 4.63 Mil/uL (ref 3.87–5.11)
RDW: 14 % (ref 11.5–15.5)
WBC: 6.6 10*3/uL (ref 4.0–10.5)

## 2016-10-19 LAB — COMPREHENSIVE METABOLIC PANEL
ALBUMIN: 4.5 g/dL (ref 3.5–5.2)
ALT: 8 U/L (ref 0–35)
AST: 15 U/L (ref 0–37)
Alkaline Phosphatase: 45 U/L (ref 39–117)
BUN: 14 mg/dL (ref 6–23)
CALCIUM: 9.8 mg/dL (ref 8.4–10.5)
CHLORIDE: 103 meq/L (ref 96–112)
CO2: 27 mEq/L (ref 19–32)
Creatinine, Ser: 0.79 mg/dL (ref 0.40–1.20)
GFR: 85.29 mL/min (ref >60.00)
Glucose, Bld: 94 mg/dL (ref 70–99)
POTASSIUM: 4.3 meq/L (ref 3.5–5.1)
Sodium: 137 mEq/L (ref 135–145)
Total Bilirubin: 0.5 mg/dL (ref 0.2–1.2)
Total Protein: 7.1 g/dL (ref 6.0–8.3)

## 2016-10-19 LAB — LIPID PANEL
CHOL/HDL RATIO: 3
CHOLESTEROL: 175 mg/dL (ref 0–200)
HDL: 56.5 mg/dL (ref >39.00)
LDL CALC: 105 mg/dL — AB (ref 0–99)
NonHDL: 118.26
TRIGLYCERIDES: 66 mg/dL (ref 0.0–149.0)
VLDL: 13.2 mg/dL (ref 0.0–40.0)

## 2016-10-19 LAB — TSH: TSH: 1.8 u[IU]/mL (ref 0.35–4.50)

## 2016-10-19 MED ORDER — EPINEPHRINE 0.3 MG/0.3ML IJ SOAJ: 0.3000 mg | Freq: Once | INTRAMUSCULAR | 1 refills | Status: AC

## 2016-10-19 NOTE — Progress Notes (Signed)
Office Note 10/19/2016  CC:  Chief Complaint  Patient presents with  . Annual Exam    Pt is not fasting.    HPI:  Melissa Mullen is a 41 y.o. White female who is here for annual health maintenance exam. She has a GYN in [place] and got seen there last week, Pap was done and was normal.  Mammogram scheduled.   At recent GYN visit, an ultrasound was done for some lower abd/pelvic pain, frequent menses---got pelvic ultrasound yesterday and results are pending.  If no cyst then she'll likely start OCP.    Eyes: exam 4 mo ago, no changes. Dental preventatives UTD. Exercise: walks dog twice a day. Diet: nothing at this time.  She is fasting except a tiny bit of cream in coffee this morning.  Past Medical History:  Diagnosis Date  . Anxiety   . Depression   . Encounter for IUD removal 05/11/2014  . Hay fever    singulair and zyrtec are helpful  . History of thyroid cancer 2001  . IUD (intrauterine device) in place 11/14/2012   Put in 2011  . Migraine syndrome    very sporadic, triggers unknown  . Nephrolithiasis 2012   No imaging has been done.  Marland Kitchen Postablative hypothyroidism 2002  . TMJ arthralgia    flexeril prn is helpful  . Vaginal Pap smear, abnormal     Past Surgical History:  Procedure Laterality Date  . thyroid ablation  2001  . TONSILLECTOMY AND ADENOIDECTOMY  1980s  . WISDOM TOOTH EXTRACTION  1996    Family History  Problem Relation Age of Onset  . Hyperlipidemia Mother   . Diabetes Father   . Hypertension Father   . Hyperlipidemia Father   . Cancer Paternal Aunt        ovarian  . Cancer Paternal Uncle   . Diabetes Paternal Grandfather   . Hypertension Paternal Grandfather   . Stroke Paternal Grandfather     Social History   Social History  . Marital status: Married    Spouse name: N/A  . Number of children: N/A  . Years of education: N/A   Occupational History  . Not on file.   Social History Main Topics  . Smoking status: Former  Smoker    Types: Cigarettes    Quit date: 04/10/2010  . Smokeless tobacco: Never Used  . Alcohol use Yes     Comment: occ  . Drug use: No  . Sexual activity: Yes   Other Topics Concern  . Not on file   Social History Narrative   Married, mother of one son.   Stay at home mom.     No Tob, minimal alc.   Exercise: sporadically          Outpatient Medications Prior to Visit  Medication Sig Dispense Refill  . ALPRAZolam (XANAX) 0.25 MG tablet TAKE ONE TABLET TWICE DAILY AS NEEDED FOR ANXIETY 30 tablet 3  . cetirizine (ZYRTEC) 10 MG tablet Take 1 tablet (10 mg total) by mouth daily. 30 tablet 11  . cyclobenzaprine (FLEXERIL) 5 MG tablet 1 tab po tid prn TMJ pain 30 tablet 3  . diphenhydrAMINE (BENADRYL) 25 MG tablet Take 50 mg by mouth once as needed for itching or allergies (TAKEN ONCE IN RESPONSE TO ALLERGIC REACTION).    . fluconazole (DIFLUCAN) 150 MG tablet Take1  every month before period 1 tablet 6  . levothyroxine (SYNTHROID) 112 MCG tablet Take 1 tablet (112 mcg total) by mouth daily.  90 tablet 3  . montelukast (SINGULAIR) 10 MG tablet TAKE 1 TABLET AT BEDTIME 90 tablet 3  . EPINEPHrine 0.3 mg/0.3 mL IJ SOAJ injection Inject 0.3 mLs (0.3 mg total) into the muscle once. 1 Device 3   No facility-administered medications prior to visit.     Allergies  Allergen Reactions  . Bee Venom Itching and Swelling  . Codeine Nausea Only    ROS Review of Systems  Constitutional: Negative for appetite change, chills, fatigue and fever.  HENT: Negative for congestion, dental problem, ear pain and sore throat.   Eyes: Negative for discharge, redness and visual disturbance.  Respiratory: Negative for cough, chest tightness, shortness of breath and wheezing.   Cardiovascular: Negative for chest pain, palpitations and leg swelling.  Gastrointestinal: Negative for abdominal pain, blood in stool, diarrhea, nausea and vomiting.  Genitourinary: Negative for difficulty urinating, dysuria,  flank pain, frequency, hematuria and urgency.  Musculoskeletal: Negative for arthralgias, back pain, joint swelling, myalgias and neck stiffness.  Skin: Negative for pallor and rash.  Neurological: Negative for dizziness, speech difficulty, weakness and headaches.  Hematological: Negative for adenopathy. Does not bruise/bleed easily.  Psychiatric/Behavioral: Negative for confusion and sleep disturbance. The patient is not nervous/anxious.     PE; Blood pressure 94/64, pulse 88, temperature 97.9 F (36.6 C), temperature source Oral, resp. rate 16, height 5' (1.524 m), weight 129 lb 8 oz (58.7 kg), last menstrual period 10/14/2016, SpO2 100 %. Gen: Alert, well appearing.  Patient is oriented to person, place, time, and situation. AFFECT: pleasant, lucid thought and speech. ENT: Ears: EACs clear, normal epithelium.  TMs with good light reflex and landmarks bilaterally.  Eyes: no injection, icteris, swelling, or exudate.  EOMI, PERRLA. Nose: no drainage or turbinate edema/swelling.  No injection or focal lesion.  Mouth: lips without lesion/swelling.  Oral mucosa pink and moist.  Dentition intact and without obvious caries or gingival swelling.  Oropharynx without erythema, exudate, or swelling.  Neck: supple/nontender.  No LAD, mass, or TM.  Carotid pulses 2+ bilaterally, without bruits. CV: RRR, no m/r/g.   LUNGS: CTA bilat, nonlabored resps, good aeration in all lung fields. ABD: soft, NT, ND, BS normal.  No hepatospenomegaly or mass.  No bruits. EXT: no clubbing, cyanosis, or edema.  Musculoskeletal: no joint swelling, erythema, warmth, or tenderness.  ROM of all joints intact. Skin - no sores or suspicious lesions or rashes or color changes.  She has a light brown flat papule about 4 mm diameter on right upper arm, distinct borders.  No melanin pigment.   Pertinent labs:  Lab Results  Component Value Date   TSH 1.68 02/18/2016   Lab Results  Component Value Date   WBC 6.4 02/03/2014    HGB 14.2 02/03/2014   HCT 43.3 02/03/2014   MCV 90.1 02/03/2014   PLT 335.0 02/03/2014   Lab Results  Component Value Date   CREATININE 0.8 02/03/2014   BUN 14 02/03/2014   NA 137 02/03/2014   K 4.3 02/03/2014   CL 105 02/03/2014   CO2 25 02/03/2014   Lab Results  Component Value Date   ALT 11 02/03/2014   AST 17 02/03/2014   ALKPHOS 48 02/03/2014   BILITOT 0.7 02/03/2014   Lab Results  Component Value Date   CHOL 159 08/06/2014   Lab Results  Component Value Date   HDL 46.30 08/06/2014   Lab Results  Component Value Date   LDLCALC 102 (H) 08/06/2014   Lab Results  Component Value Date  TRIG 55.0 08/06/2014   Lab Results  Component Value Date   CHOLHDL 3 08/06/2014    ASSESSMENT AND PLAN:   Health maintenance exam: Reviewed age and gender appropriate health maintenance issues (prudent diet, regular exercise, health risks of tobacco and excessive alcohol, use of seatbelts, fire alarms in home, use of sunscreen).  Also reviewed age and gender appropriate health screening as well as vaccine recommendations. Vaccines: UTD Labs: HP labs today.  HIV screening declined. Pap normal recently at GYN, mammo ordered. Skin: dermatofibroma on R upper arm--reassured.  She will return if it changes significantly, particularly if dark pigment is noted.  An After Visit Summary was printed and given to the patient.   FOLLOW UP:  Return in about 6 months (around 04/21/2017) for routine chronic illness f/u.  Signed:  Crissie Sickles, MD           10/19/2016

## 2016-10-20 ENCOUNTER — Encounter: Payer: Self-pay | Admitting: *Deleted

## 2016-10-23 ENCOUNTER — Encounter: Payer: Self-pay | Admitting: Adult Health

## 2016-10-23 ENCOUNTER — Telehealth: Payer: Self-pay | Admitting: Adult Health

## 2016-10-23 DIAGNOSIS — D251 Intramural leiomyoma of uterus: Secondary | ICD-10-CM

## 2016-10-23 HISTORY — DX: Intramural leiomyoma of uterus: D25.1

## 2016-10-23 NOTE — Telephone Encounter (Signed)
Pt aware that US shows small fibroids, could consider IUD, POP, depo, or ablation, she does smoke occasionally with [employer]

## 2017-01-02 ENCOUNTER — Other Ambulatory Visit: Payer: Self-pay | Admitting: Family Medicine

## 2017-04-04 ENCOUNTER — Other Ambulatory Visit: Payer: Self-pay | Admitting: Family Medicine

## 2017-04-20 ENCOUNTER — Ambulatory Visit: Payer: BLUE CROSS/BLUE SHIELD | Admitting: Family Medicine

## 2017-05-28 ENCOUNTER — Encounter: Payer: Self-pay | Admitting: Family Medicine

## 2017-05-28 ENCOUNTER — Ambulatory Visit (INDEPENDENT_AMBULATORY_CARE_PROVIDER_SITE_OTHER): Payer: BLUE CROSS/BLUE SHIELD | Admitting: Family Medicine

## 2017-05-28 VITALS — BP 128/73 | HR 85 | Temp 98.2°F | Resp 20 | Wt 138.0 lb

## 2017-05-28 DIAGNOSIS — J0100 Acute maxillary sinusitis, unspecified: Secondary | ICD-10-CM | POA: Diagnosis not present

## 2017-05-28 MED ORDER — AMOXICILLIN-POT CLAVULANATE 875-125 MG PO TABS: 1.0000 | ORAL_TABLET | Freq: Two times a day (BID) | ORAL | 0 refills | Status: DC

## 2017-05-28 MED ORDER — BENZONATATE 200 MG PO CAPS: 200.0000 mg | ORAL_CAPSULE | Freq: Two times a day (BID) | ORAL | 0 refills | Status: DC | PRN

## 2017-05-28 MED ORDER — PREDNISONE 50 MG PO TABS: 50.0000 mg | ORAL_TABLET | Freq: Every day | ORAL | 0 refills | Status: DC

## 2017-05-28 NOTE — Progress Notes (Signed)
Melissa Mullen , [birth year], 42 y.o., female MRN: [id number] Patient Care Team    Relationship Specialty Notifications Start End  McGowen, Adrian Blackwater, MD PCP - General Family Medicine  01/31/12     Chief Complaint  Patient presents with  . URI    congestion,facial pain, cough     Subjective: Pt presents for an OV with complaints of cough of > 7 days duration.  Associated symptoms include low grade fever, productive cough, sinus pressure, headache, chest burning, teeth pain. She reports she has tried many over the counter sinus and cold regimens.  She has been ill off and on since her Influenza infection in January. She is a Oncologist.   Depression screen PHQ 2/9 10/16/2016  Decreased Interest 0  Down, Depressed, Hopeless 0  PHQ - 2 Score 0    Allergies  Allergen Reactions  . Bee Venom Itching and Swelling  . Codeine Nausea Only   Social History   Tobacco Use  . Smoking status: Former Smoker    Types: Cigarettes    Last attempt to quit: 04/10/2010    Years since quitting: 7.1  . Smokeless tobacco: Never Used  Substance Use Topics  . Alcohol use: Yes    Comment: occ   Past Medical History:  Diagnosis Date  . Anxiety   . Depression   . Encounter for IUD removal 05/11/2014  . Fibroids, intramural 10/23/2016  . Hay fever    singulair and zyrtec are helpful  . History of thyroid cancer 2001  . IUD (intrauterine device) in place 11/14/2012   Put in 2011  . Migraine syndrome    very sporadic, triggers unknown  . Nephrolithiasis 2012   No imaging has been done.  Marland Kitchen Postablative hypothyroidism 2002  . TMJ arthralgia    flexeril prn is helpful  . Vaginal Pap smear, abnormal    Past Surgical History:  Procedure Laterality Date  . thyroid ablation  2001  . TONSILLECTOMY AND ADENOIDECTOMY  1980s  . WISDOM TOOTH EXTRACTION  1996   Family History  Problem Relation Age of Onset  . Hyperlipidemia Mother   . Diabetes Father   . Hypertension Father   .  Hyperlipidemia Father   . Cancer Paternal Aunt        ovarian  . Cancer Paternal Uncle   . Diabetes Paternal Grandfather   . Hypertension Paternal Grandfather   . Stroke Paternal Grandfather    Allergies as of 05/28/2017      Reactions   Bee Venom Itching, Swelling   Codeine Nausea Only      Medication List        Accurate as of 05/28/17  2:39 PM. Always use your most recent med list.          ALPRAZolam 0.25 MG tablet Commonly known as:  XANAX TAKE ONE TABLET TWICE DAILY AS NEEDED FOR ANXIETY   cetirizine 10 MG tablet Commonly known as:  ZYRTEC Take 1 tablet (10 mg total) by mouth daily.   cyclobenzaprine 5 MG tablet Commonly known as:  FLEXERIL 1 tab po tid prn TMJ pain   diphenhydrAMINE 25 MG tablet Commonly known as:  BENADRYL Take 50 mg by mouth once as needed for itching or allergies (TAKEN ONCE IN RESPONSE TO ALLERGIC REACTION).   fluconazole 150 MG tablet Commonly known as:  DIFLUCAN Take1  every month before period   levothyroxine 112 MCG tablet Commonly known as:  SYNTHROID Take 1 tablet (112 mcg total) by mouth  daily.   montelukast 10 MG tablet Commonly known as:  SINGULAIR TAKE 1 TABLET AT BEDTIME       All past medical history, surgical history, allergies, family history, immunizations andmedications were updated in the EMR today and reviewed under the history and medication portions of their EMR.     ROS: Negative, with the exception of above mentioned in HPI   Objective:  BP 128/73 (BP Location: Left Arm, Patient Position: Sitting, Cuff Size: Normal)   Pulse 85   Temp 98.2 F (36.8 C)   Resp 20   Wt 138 lb (62.6 kg)   LMP 05/21/2017   SpO2 99%   BMI 26.95 kg/m  Body mass index is 26.95 kg/m. Gen: Afebrile. No acute distress. Nontoxic in appearance, well developed, well nourished.  HENT: AT. [place]. Bilateral TM visualized with erythema or fullness. MMM, no oral lesions. Bilateral nares with erythema, drainage and swelling. Throat  without erythema or exudates. Cough, hoarseness and TTP max sinus present Eyes:Pupils Equal Round Reactive to light, Extraocular movements intact,  Conjunctiva without redness, discharge or icterus. Neck/lymp/endocrine: Supple,left ant cervical lymphadenopathy CV: RRR  Chest: CTAB, no wheeze or crackles. Good air movement, normal resp effort. Cough with deep inspiration.  Neuro:  Normal gait. PERLA. EOMi. Alert. Oriented x3  No exam data present No results found. No results found for this or any previous visit (from the past 24 hour(s)).  Assessment/Plan: [name] is a 42 y.o. female present for OV for  1. Acute non-recurrent maxillary sinusitis Rest, hydrate.  + flonase, mucinex (DM if cough), nettie pot or nasal saline.  Augmentin, prednisone, tesslon perles prescribed, take until completed.  If cough present it can last up to 6-8 weeks.  F/U 2 weeks of not improved.   - amoxicillin-clavulanate (AUGMENTIN) 875-125 MG tablet; Take 1 tablet by mouth 2 (two) times daily.  Dispense: 20 tablet; Refill: 0 - predniSONE (DELTASONE) 50 MG tablet; Take 1 tablet (50 mg total) by mouth daily with breakfast.  Dispense: 5 tablet; Refill: 0   Reviewed expectations re: course of current medical issues.  Discussed self-management of symptoms.  Outlined signs and symptoms indicating need for more acute intervention.  Patient verbalized understanding and all questions were answered.  Patient received an After-Visit Summary.    No orders of the defined types were placed in this encounter.    Note is dictated utilizing voice recognition software. Although note has been proof read prior to signing, occasional typographical errors still can be missed. If any questions arise, please do not hesitate to call for verification.   electronically signed by:  Howard Pouch, DO  [place]

## 2017-05-28 NOTE — Patient Instructions (Signed)
Rest, hydrate.  + flonase, mucinex (DM if cough), nettie pot or nasal saline.  Augmentin, prednisone and tessalon perles prescribed, take until antibiotic completed.  If cough present it can last up to 6-8 weeks.  F/U 2 weeks of not improved.    I believe you have a sinus infection.    Sinusitis, Adult Sinusitis is soreness and inflammation of your sinuses. Sinuses are hollow spaces in the bones around your face. They are located:  Around your eyes.  In the middle of your forehead.  Behind your nose.  In your cheekbones.  Your sinuses and nasal passages are lined with a stringy fluid (mucus). Mucus normally drains out of your sinuses. When your nasal tissues get inflamed or swollen, the mucus can get trapped or blocked so air cannot flow through your sinuses. This lets bacteria, viruses, and funguses grow, and that leads to infection. Follow these instructions at home: Medicines  Take, use, or apply over-the-counter and prescription medicines only as told by your doctor. These may include nasal sprays.  If you were prescribed an antibiotic medicine, take it as told by your doctor. Do not stop taking the antibiotic even if you start to feel better. Hydrate and Humidify  Drink enough water to keep your pee (urine) clear or pale yellow.  Use a cool mist humidifier to keep the humidity level in your home above 50%.  Breathe in steam for 10-15 minutes, 3-4 times a day or as told by your doctor. You can do this in the bathroom while a hot shower is running.  Try not to spend time in cool or dry air. Rest  Rest as much as possible.  Sleep with your head raised (elevated).  Make sure to get enough sleep each night. General instructions  Put a warm, moist washcloth on your face 3-4 times a day or as told by your doctor. This will help with discomfort.  Wash your hands often with soap and water. If there is no soap and water, use hand sanitizer.  Do not smoke. Avoid being  around people who are smoking (secondhand smoke).  Keep all follow-up visits as told by your doctor. This is important. Contact a doctor if:  You have a fever.  Your symptoms get worse.  Your symptoms do not get better within 10 days. Get help right away if:  You have a very bad headache.  You cannot stop throwing up (vomiting).  You have pain or swelling around your face or eyes.  You have trouble seeing.  You feel confused.  Your neck is stiff.  You have trouble breathing. This information is not intended to replace advice given to you by your health care provider. Make sure you discuss any questions you have with your health care provider. Document Released: 09/13/2007 Document Revised: 11/21/2015 Document Reviewed: 01/20/2015 Elsevier Interactive Patient Education  [employer].

## 2017-06-19 ENCOUNTER — Other Ambulatory Visit: Payer: Self-pay | Admitting: Family Medicine

## 2017-07-02 ENCOUNTER — Telehealth: Payer: Self-pay | Admitting: Family Medicine

## 2017-07-02 ENCOUNTER — Ambulatory Visit: Payer: BLUE CROSS/BLUE SHIELD | Admitting: Family Medicine

## 2017-07-02 DIAGNOSIS — E890 Postprocedural hypothyroidism: Secondary | ICD-10-CM

## 2017-07-02 NOTE — Progress Notes (Deleted)
OFFICE VISIT  07/02/2017   CC: No chief complaint on file.  HPI:    Patient is a 42 y.o. Caucasian female who presents for f/u postablative hypothyroidism, anxiety  Past Medical History:  Diagnosis Date  . Anxiety   . Depression   . Encounter for IUD removal 05/11/2014  . Fibroids, intramural 10/23/2016  . Hay fever    singulair and zyrtec are helpful  . History of thyroid cancer 2001  . IUD (intrauterine device) in place 11/14/2012   Put in 2011  . Migraine syndrome    very sporadic, triggers unknown  . Nephrolithiasis 2012   No imaging has been done.  Marland Kitchen Postablative hypothyroidism 2002  . TMJ arthralgia    flexeril prn is helpful  . Vaginal Pap smear, abnormal     Past Surgical History:  Procedure Laterality Date  . thyroid ablation  2001  . TONSILLECTOMY AND ADENOIDECTOMY  1980s  . [place]    Outpatient Medications Prior to Visit  Medication Sig Dispense Refill  . ALPRAZolam (XANAX) 0.25 MG tablet TAKE ONE TABLET TWICE DAILY AS NEEDED FOR ANXIETY 30 tablet 3  . amoxicillin-clavulanate (AUGMENTIN) 875-125 MG tablet Take 1 tablet by mouth 2 (two) times daily. 20 tablet 0  . benzonatate (TESSALON) 200 MG capsule Take 1 capsule (200 mg total) by mouth 2 (two) times daily as needed for cough. 20 capsule 0  . cetirizine (ZYRTEC) 10 MG tablet Take 1 tablet (10 mg total) by mouth daily. 30 tablet 11  . cyclobenzaprine (FLEXERIL) 5 MG tablet 1 tab po tid prn TMJ pain 30 tablet 3  . diphenhydrAMINE (BENADRYL) 25 MG tablet Take 50 mg by mouth once as needed for itching or allergies (TAKEN ONCE IN RESPONSE TO ALLERGIC REACTION).    . fluconazole (DIFLUCAN) 150 MG tablet Take1  every month before period 1 tablet 6  . levothyroxine (SYNTHROID) 112 MCG tablet Take 1 tablet (112 mcg total) by mouth daily. 90 tablet 3  . montelukast (SINGULAIR) 10 MG tablet TAKE 1 TABLET AT BEDTIME. 90 tablet 0  . predniSONE (DELTASONE) 50 MG tablet Take 1 tablet (50 mg total) by  mouth daily with breakfast. 5 tablet 0   No facility-administered medications prior to visit.     Allergies  Allergen Reactions  . Bee Venom Itching and Swelling  . Codeine Nausea Only    ROS As per HPI  PE: There were no vitals taken for this visit. ***  LABS:  Lab Results  Component Value Date   TSH 1.80 10/19/2016     Chemistry      Component Value Date/Time   NA 137 10/19/2016 1019   K 4.3 10/19/2016 1019   CL 103 10/19/2016 1019   CO2 27 10/19/2016 1019   BUN 14 10/19/2016 1019   CREATININE 0.79 10/19/2016 1019      Component Value Date/Time   CALCIUM 9.8 10/19/2016 1019   ALKPHOS 45 10/19/2016 1019   AST 15 10/19/2016 1019   ALT 8 10/19/2016 1019   BILITOT 0.5 10/19/2016 1019     Lab Results  Component Value Date   WBC 6.6 10/19/2016   HGB 14.2 10/19/2016   HCT 42.1 10/19/2016   MCV 90.9 10/19/2016   PLT 369.0 10/19/2016   Lab Results  Component Value Date   CHOL 175 10/19/2016   HDL 56.50 10/19/2016   LDLCALC 105 (H) 10/19/2016   TRIG 66.0 10/19/2016   CHOLHDL 3 10/19/2016     IMPRESSION AND  PLAN:  No problem-specific Assessment & Plan notes found for this encounter.   FOLLOW UP: No follow-ups on file.

## 2017-07-02 NOTE — Telephone Encounter (Signed)
If pt comes in today pls have her blood drawn for TSH---lab has been ordered.-thx

## 2017-07-09 ENCOUNTER — Other Ambulatory Visit: Payer: Self-pay | Admitting: Family Medicine

## 2017-07-09 NOTE — Telephone Encounter (Signed)
[place]  RF request for alprazolam LOV: 10/19/16 Next ov: None Last written: 06/13/16 #30 w/ 3RF  RF request for cyclobenzaprine Last written: 09/28/15 #30 w/ 3RF  Please advise. Thanks.

## 2017-07-10 NOTE — Telephone Encounter (Signed)
Rx faxed

## 2017-09-13 ENCOUNTER — Ambulatory Visit: Payer: BLUE CROSS/BLUE SHIELD | Admitting: Family Medicine

## 2017-09-14 ENCOUNTER — Encounter: Payer: Self-pay | Admitting: Family Medicine

## 2017-09-14 ENCOUNTER — Ambulatory Visit (INDEPENDENT_AMBULATORY_CARE_PROVIDER_SITE_OTHER): Payer: BLUE CROSS/BLUE SHIELD | Admitting: Family Medicine

## 2017-09-14 VITALS — BP 100/64 | HR 86 | Temp 98.1°F | Resp 16 | Ht 60.0 in | Wt 128.5 lb

## 2017-09-14 DIAGNOSIS — F411 Generalized anxiety disorder: Secondary | ICD-10-CM | POA: Diagnosis not present

## 2017-09-14 DIAGNOSIS — Z79899 Other long term (current) drug therapy: Secondary | ICD-10-CM

## 2017-09-14 DIAGNOSIS — E890 Postprocedural hypothyroidism: Secondary | ICD-10-CM | POA: Diagnosis not present

## 2017-09-14 DIAGNOSIS — J301 Allergic rhinitis due to pollen: Secondary | ICD-10-CM | POA: Diagnosis not present

## 2017-09-14 LAB — TSH: TSH: 0.34 u[IU]/mL — AB (ref 0.35–4.50)

## 2017-09-14 MED ORDER — MONTELUKAST SODIUM 10 MG PO TABS: 10.0000 mg | ORAL_TABLET | Freq: Every day | ORAL | 3 refills | Status: DC

## 2017-09-14 MED ORDER — LEVOTHYROXINE SODIUM 112 MCG PO TABS: 112.0000 ug | ORAL_TABLET | Freq: Every day | ORAL | 3 refills | Status: DC

## 2017-09-14 NOTE — Progress Notes (Signed)
OFFICE VISIT  09/14/2017   CC:  Chief Complaint  Patient presents with  . Follow-up    [facility], pt is not fasting.      HPI:    Patient is a 42 y.o. Caucasian female who presents for f/u postablation hypothyroidism, anxiety with occasional need for low dose xanax, and allergic rhinitis.  Hypoth: compliant with thyroid med: takes correctly.  Anxiety, intermittent--only occ need for low dose xanax. Discussed need for controlled substance contract today.  Allergic rhinitis: well controlled on zyrtec and singulair daily.  ROS: no HAs, no palpitations, no SOB, no neck pain or dysphagia, no panic attacks, no sinus pain, no fevers, no cough or wheeze.  Past Medical History:  Diagnosis Date  . Anxiety   . Depression   . Encounter for IUD removal 05/11/2014  . Fibroids, intramural 10/23/2016  . Hay fever    singulair and zyrtec are helpful  . History of thyroid cancer 2001  . IUD (intrauterine device) in place 11/14/2012   Put in 2011  . Migraine syndrome    very sporadic, triggers unknown  . Nephrolithiasis 2012   No imaging has been done.  Marland Kitchen Postablative hypothyroidism 2002  . TMJ arthralgia    flexeril prn is helpful  . Vaginal Pap smear, abnormal     Past Surgical History:  Procedure Laterality Date  . thyroid ablation  2001  . TONSILLECTOMY AND ADENOIDECTOMY  1980s  . [place]    Outpatient Medications Prior to Visit  Medication Sig Dispense Refill  . ALPRAZolam (XANAX) 0.25 MG tablet TAKE ONE TABLET TWICE DAILY AS NEEDED FOR ANXIETY. 30 tablet 3  . cetirizine (ZYRTEC) 10 MG tablet Take 1 tablet (10 mg total) by mouth daily. 30 tablet 11  . cyclobenzaprine (FLEXERIL) 5 MG tablet TAKE ONE (1) TABLET BY MOUTH 3 TIMES DAILY AS NEEDED FOR TMJ PAIN 30 tablet 3  . diphenhydrAMINE (BENADRYL) 25 MG tablet Take 50 mg by mouth once as needed for itching or allergies (TAKEN ONCE IN RESPONSE TO ALLERGIC REACTION).    Marland Kitchen levothyroxine (SYNTHROID) 112 MCG tablet  Take 1 tablet (112 mcg total) by mouth daily. 90 tablet 3  . montelukast (SINGULAIR) 10 MG tablet TAKE 1 TABLET AT BEDTIME. 90 tablet 0  . amoxicillin-clavulanate (AUGMENTIN) 875-125 MG tablet Take 1 tablet by mouth 2 (two) times daily. (Patient not taking: Reported on 09/14/2017) 20 tablet 0  . benzonatate (TESSALON) 200 MG capsule Take 1 capsule (200 mg total) by mouth 2 (two) times daily as needed for cough. (Patient not taking: Reported on 09/14/2017) 20 capsule 0  . fluconazole (DIFLUCAN) 150 MG tablet Take1  every month before period (Patient not taking: Reported on 09/14/2017) 1 tablet 6  . predniSONE (DELTASONE) 50 MG tablet Take 1 tablet (50 mg total) by mouth daily with breakfast. (Patient not taking: Reported on 09/14/2017) 5 tablet 0   No facility-administered medications prior to visit.     Allergies  Allergen Reactions  . Bee Venom Itching and Swelling  . Codeine Nausea Only    ROS As per HPI  PE: Blood pressure 100/64, pulse 86, temperature 98.1 F (36.7 C), temperature source Oral, resp. rate 16, height 5' (1.524 m), weight 128 lb 8 oz (58.3 kg), last menstrual period 09/10/2017, SpO2 100 %. Body mass index is 25.1 kg/m.   Wt Readings from Last 2 Encounters:  09/14/17 128 lb 8 oz (58.3 kg)  05/28/17 138 lb (62.6 kg)    Gen: alert, oriented  x 4, affect pleasant.  Lucid thinking and conversation noted. HEENT: PERRLA, EOMI.   Neck: no LAD, mass, or thyromegaly. CV: RRR, no m/r/g LUNGS: CTA bilat, nonlabored. NEURO: no tremor or tics noted on observation.  Coordination intact. CN 2-12 grossly intact bilaterally, strength 5/5 in all extremeties.  No ataxia.   LABS:    Chemistry      Component Value Date/Time   NA 137 10/19/2016 1019   K 4.3 10/19/2016 1019   CL 103 10/19/2016 1019   CO2 27 10/19/2016 1019   BUN 14 10/19/2016 1019   CREATININE 0.79 10/19/2016 1019      Component Value Date/Time   CALCIUM 9.8 10/19/2016 1019   ALKPHOS 45 10/19/2016 1019   AST 15  10/19/2016 1019   ALT 8 10/19/2016 1019   BILITOT 0.5 10/19/2016 1019     Lab Results  Component Value Date   TSH 1.80 10/19/2016    IMPRESSION AND PLAN:  1) Postablative hypothyroidism: doing well. Due for TSH monitoring today. RF'd levothyroxine today.  2) GAD, only occ need for xanax 0.25mg . Controlled substance contract reviewed with patient today.  Patient signed this and it will be placed in the chart.   No new rx for this med was given today.  3) Allergic rhinits: doing well on daily zyrtec and singulair, occ otc sudafed. She does not tolerate nasal sprays. Singulair RF today.  An After Visit Summary was printed and given to the patient.  FOLLOW UP: Return for 6-12 months for annual CPE (fasting).  Signed:  Crissie Sickles, MD           09/14/2017

## 2017-09-17 ENCOUNTER — Encounter: Payer: Self-pay | Admitting: *Deleted

## 2017-12-14 ENCOUNTER — Encounter: Payer: Self-pay | Admitting: Family Medicine

## 2018-02-18 ENCOUNTER — Encounter: Payer: Self-pay | Admitting: Family Medicine

## 2018-02-18 ENCOUNTER — Ambulatory Visit: Payer: BC Managed Care – PPO | Admitting: Family Medicine

## 2018-02-18 VITALS — BP 93/58 | HR 77 | Temp 98.3°F | Resp 16 | Ht 60.0 in | Wt 127.0 lb

## 2018-02-18 DIAGNOSIS — J309 Allergic rhinitis, unspecified: Secondary | ICD-10-CM

## 2018-02-18 DIAGNOSIS — F411 Generalized anxiety disorder: Secondary | ICD-10-CM | POA: Diagnosis not present

## 2018-02-18 DIAGNOSIS — M26623 Arthralgia of bilateral temporomandibular joint: Secondary | ICD-10-CM | POA: Diagnosis not present

## 2018-02-18 DIAGNOSIS — E890 Postprocedural hypothyroidism: Secondary | ICD-10-CM | POA: Diagnosis not present

## 2018-02-18 DIAGNOSIS — Z23 Encounter for immunization: Secondary | ICD-10-CM

## 2018-02-18 MED ORDER — MONTELUKAST SODIUM 10 MG PO TABS: 10.0000 mg | ORAL_TABLET | Freq: Every day | ORAL | 3 refills | Status: DC

## 2018-02-18 MED ORDER — ALPRAZOLAM 0.25 MG PO TABS
ORAL_TABLET | ORAL | 3 refills | Status: DC
Start: 2018-02-18 — End: 2018-11-25

## 2018-02-18 MED ORDER — CYCLOBENZAPRINE HCL 5 MG PO TABS
ORAL_TABLET | ORAL | 3 refills | Status: DC
Start: 2018-02-18 — End: 2019-05-20

## 2018-02-18 MED ORDER — CETIRIZINE HCL 10 MG PO TABS: 10.0000 mg | ORAL_TABLET | Freq: Every day | ORAL | 3 refills | Status: DC

## 2018-02-18 NOTE — Progress Notes (Signed)
OFFICE VISIT  02/18/2018   CC:  Chief Complaint  Patient presents with  . Follow-up    RCI   HPI:    Patient is a 42 y.o. Caucasian female who presents for f/u postablative hypothyroidism, anxiety, allergic rhinitis, and TMJ pain syndrome.  Hypoth: takes med on empty stomach 30 min prior to eating and w/out any other meds. Energy level good be better but she admits she runs constantly.   Anxiety: does well on as needed alprazolam, uses very infrequently.   Allerg rhin: meds working well. TMJ: uses flexeril on prn basis.  ROS: no CP, no SOB, no wheezing, no cough, no dizziness, no HAs, no rashes, no melena/hematochezia.  No polyuria or polydipsia.  No myalgias or arthralgias.   Past Medical History:  Diagnosis Date  . Anxiety   . Depression   . Encounter for IUD removal 05/11/2014  . Fibroids, intramural 10/23/2016  . Hay fever    singulair and zyrtec are helpful  . History of thyroid cancer 2001  . IUD (intrauterine device) in place 11/14/2012   Put in 2011  . Migraine syndrome    very sporadic, triggers unknown  . Nephrolithiasis 2012   No imaging has been done.  Marland Kitchen Postablative hypothyroidism 2002  . TMJ arthralgia    flexeril prn is helpful  . Vaginal Pap smear, abnormal     Past Surgical History:  Procedure Laterality Date  . thyroid ablation  2001  . TONSILLECTOMY AND ADENOIDECTOMY  1980s  . [place]    Outpatient Medications Prior to Visit  Medication Sig Dispense Refill  . diphenhydrAMINE (BENADRYL) 25 MG tablet Take 50 mg by mouth once as needed for itching or allergies (TAKEN ONCE IN RESPONSE TO ALLERGIC REACTION).    Marland Kitchen levothyroxine (SYNTHROID) 112 MCG tablet Take 1 tablet (112 mcg total) by mouth daily. 90 tablet 3  . ALPRAZolam (XANAX) 0.25 MG tablet TAKE ONE TABLET TWICE DAILY AS NEEDED FOR ANXIETY. 30 tablet 3  . cetirizine (ZYRTEC) 10 MG tablet Take 1 tablet (10 mg total) by mouth daily. 30 tablet 11  . cyclobenzaprine (FLEXERIL)  5 MG tablet TAKE ONE (1) TABLET BY MOUTH 3 TIMES DAILY AS NEEDED FOR TMJ PAIN 30 tablet 3  . montelukast (SINGULAIR) 10 MG tablet Take 1 tablet (10 mg total) by mouth at bedtime. 90 tablet 3   No facility-administered medications prior to visit.     Allergies  Allergen Reactions  . Bee Venom Itching and Swelling  . Codeine Nausea Only    ROS As per HPI  PE: Blood pressure (!) 93/58, pulse 77, temperature 98.3 F (36.8 C), temperature source Oral, resp. rate 16, height 5' (1.524 m), weight 127 lb (57.6 kg), last menstrual period 01/26/2018, SpO2 100 %. Gen: Alert, well appearing.  Patient is oriented to person, place, time, and situation. AFFECT: pleasant, lucid thought and speech. CV: RRR, no m/r/g.   LUNGS: CTA bilat, nonlabored resps, good aeration in all lung fields. No tremors.  LABS:  Lab Results  Component Value Date   TSH 0.34 (L) 09/14/2017     Chemistry      Component Value Date/Time   NA 137 10/19/2016 1019   K 4.3 10/19/2016 1019   CL 103 10/19/2016 1019   CO2 27 10/19/2016 1019   BUN 14 10/19/2016 1019   CREATININE 0.79 10/19/2016 1019      Component Value Date/Time   CALCIUM 9.8 10/19/2016 1019   ALKPHOS 45 10/19/2016 1019  AST 15 10/19/2016 1019   ALT 8 10/19/2016 1019   BILITOT 0.5 10/19/2016 1019       IMPRESSION AND PLAN:  1) Hypothyroidism. Due for TSH check (last TSH check 6 mo ago was at lowest end of normal and we did not do a dose change).  2) GAD: uses alprazolam on a very infrequent basis.  Her last rx has RF's that have expired. I did rx for #60 of the 0.25mg  xanax, 1 bid prn, #30, rF x 3. CSC UTD.  3) Allergic rhinitis: The current medical regimen is effective;  continue present plan and medications. RF'd meds.  4) TMJ pain syndrome: intermittent problem, takes flexeril tid prn. RF'd flexeril today.  An After Visit Summary was printed and given to the patient.  FOLLOW UP: Return in about 6 months (around 08/19/2018) for  annual CPE (fasting).  Signed:  Crissie Sickles, MD           02/18/2018

## 2018-02-19 LAB — TSH: TSH: 0.22 u[IU]/mL — AB (ref 0.35–4.50)

## 2018-07-08 ENCOUNTER — Other Ambulatory Visit: Payer: Self-pay | Admitting: Family Medicine

## 2018-09-11 ENCOUNTER — Encounter: Payer: Self-pay | Admitting: Family Medicine

## 2018-09-11 MED ORDER — DOXYCYCLINE HYCLATE 100 MG PO CAPS: 100.0000 mg | ORAL_CAPSULE | Freq: Two times a day (BID) | ORAL | 0 refills | Status: AC

## 2018-09-11 MED ORDER — FLUCONAZOLE 150 MG PO TABS: 150.0000 mg | ORAL_TABLET | Freq: Once | ORAL | 0 refills | Status: AC

## 2018-09-11 NOTE — Telephone Encounter (Signed)
OK, diflucan eRx'd.

## 2018-09-11 NOTE — Telephone Encounter (Signed)
Sounds like it certainly could be tick borne illness.  I'll send in doxycycline to take for 10d.  Let us know if you are not feeling better by early next week.-thx

## 2018-09-23 ENCOUNTER — Encounter: Payer: BC Managed Care – PPO | Admitting: Family Medicine

## 2018-10-02 ENCOUNTER — Encounter: Payer: Self-pay | Admitting: Family Medicine

## 2018-10-02 ENCOUNTER — Ambulatory Visit (INDEPENDENT_AMBULATORY_CARE_PROVIDER_SITE_OTHER): Payer: BC Managed Care – PPO | Admitting: Family Medicine

## 2018-10-02 ENCOUNTER — Other Ambulatory Visit: Payer: Self-pay

## 2018-10-02 ENCOUNTER — Other Ambulatory Visit (INDEPENDENT_AMBULATORY_CARE_PROVIDER_SITE_OTHER): Payer: BC Managed Care – PPO

## 2018-10-02 VITALS — BP 104/66 | HR 77 | Temp 98.2°F | Resp 16 | Ht 60.0 in | Wt 128.8 lb

## 2018-10-02 DIAGNOSIS — E890 Postprocedural hypothyroidism: Secondary | ICD-10-CM | POA: Diagnosis not present

## 2018-10-02 DIAGNOSIS — F5104 Psychophysiologic insomnia: Secondary | ICD-10-CM | POA: Diagnosis not present

## 2018-10-02 DIAGNOSIS — B882 Other arthropod infestations: Secondary | ICD-10-CM | POA: Diagnosis not present

## 2018-10-02 DIAGNOSIS — Z23 Encounter for immunization: Secondary | ICD-10-CM

## 2018-10-02 DIAGNOSIS — Z0000 Encounter for general adult medical examination without abnormal findings: Secondary | ICD-10-CM

## 2018-10-02 DIAGNOSIS — Z79899 Other long term (current) drug therapy: Secondary | ICD-10-CM

## 2018-10-02 LAB — LIPID PANEL
Cholesterol: 176 mg/dL (ref 0–200)
HDL: 58.1 mg/dL (ref >39.00)
LDL Cholesterol: 103 mg/dL — ABNORMAL HIGH (ref 0–99)
NonHDL: 118.02
Total CHOL/HDL Ratio: 3
Triglycerides: 73 mg/dL (ref 0.0–149.0)
VLDL: 14.6 mg/dL (ref 0.0–40.0)

## 2018-10-02 LAB — COMPREHENSIVE METABOLIC PANEL
ALT: 8 U/L (ref 0–35)
AST: 13 U/L (ref 0–37)
Albumin: 4.5 g/dL (ref 3.5–5.2)
Alkaline Phosphatase: 38 U/L — ABNORMAL LOW (ref 39–117)
BUN: 15 mg/dL (ref 6–23)
CO2: 28 mEq/L (ref 19–32)
Calcium: 9.4 mg/dL (ref 8.4–10.5)
Chloride: 105 mEq/L (ref 96–112)
Creatinine, Ser: 0.64 mg/dL (ref 0.40–1.20)
GFR: 101.35 mL/min (ref >60.00)
Glucose, Bld: 85 mg/dL (ref 70–99)
Potassium: 4.1 mEq/L (ref 3.5–5.1)
Sodium: 139 mEq/L (ref 135–145)
Total Bilirubin: 0.5 mg/dL (ref 0.2–1.2)
Total Protein: 6.9 g/dL (ref 6.0–8.3)

## 2018-10-02 LAB — CBC WITH DIFFERENTIAL/PLATELET
Basophils Absolute: 0.1 10*3/uL (ref 0.0–0.1)
Basophils Relative: 0.9 % (ref 0.0–3.0)
Eosinophils Absolute: 0.1 10*3/uL (ref 0.0–0.7)
Eosinophils Relative: 0.9 % (ref 0.0–5.0)
HCT: 40.4 % (ref 36.0–46.0)
Hemoglobin: 13.5 g/dL (ref 12.0–15.0)
Lymphocytes Relative: 34.2 % (ref 12.0–46.0)
Lymphs Abs: 2.1 10*3/uL (ref 0.7–4.0)
MCHC: 33.5 g/dL (ref 30.0–36.0)
MCV: 91.9 fl (ref 78.0–100.0)
Monocytes Absolute: 0.5 10*3/uL (ref 0.1–1.0)
Monocytes Relative: 8.6 % (ref 3.0–12.0)
Neutro Abs: 3.4 10*3/uL (ref 1.4–7.7)
Neutrophils Relative %: 55.4 % (ref 43.0–77.0)
Platelets: 341 10*3/uL (ref 150.0–400.0)
RBC: 4.39 Mil/uL (ref 3.87–5.11)
RDW: 14 % (ref 11.5–15.5)
WBC: 6.1 10*3/uL (ref 4.0–10.5)

## 2018-10-02 LAB — TSH: TSH: 1.02 u[IU]/mL (ref 0.35–4.50)

## 2018-10-02 NOTE — Progress Notes (Signed)
Office Note 10/02/2018  CC:  Chief Complaint  Patient presents with  . Annual Exam    pt is fasting    HPI:  Melissa Mullen is a 43 y.o. White female who is here for annual health maintenance exam.  Anxiety related insomnia: has been on prn alprazolam long term and used this med responsibly and it has been helpful.  Uses this approx once a month.  Most recent dose approx 4-5 days ago.  She recently had an illness that we felt was tick borne-->fatigue, body aches, HA, no fevers.  She did have oval red rash around the site of the bite.  After about 3 days of doxy she started feeling better. Now back to baseline.  No acute complaints. Travel baseball just started back for her son and she is exhausted right now from this!  Past Medical History:  Diagnosis Date  . Anxiety   . Depression   . Encounter for IUD removal 05/11/2014  . Fibroids, intramural 10/23/2016  . Hay fever    singulair and zyrtec are helpful  . History of thyroid cancer 2001  . IUD (intrauterine device) in place 11/14/2012   Put in 2011  . Migraine syndrome    very sporadic, triggers unknown  . Nephrolithiasis 2012   No imaging has been done.  Marland Kitchen Postablative hypothyroidism 2002  . TMJ arthralgia    flexeril prn is helpful  . Vaginal Pap smear, abnormal     Past Surgical History:  Procedure Laterality Date  . thyroid ablation  2001  . TONSILLECTOMY AND ADENOIDECTOMY  1980s  . WISDOM TOOTH EXTRACTION  1996    Family History  Problem Relation Age of Onset  . Hyperlipidemia Mother   . Diabetes Father   . Hypertension Father   . Hyperlipidemia Father   . Cancer Paternal Aunt        ovarian  . Cancer Paternal Uncle   . Diabetes Paternal Grandfather   . Hypertension Paternal Grandfather   . Stroke Paternal Grandfather     Social History   Socioeconomic History  . Marital status: Married    Spouse name: Not on file  . Number of children: Not on file  . Years of education: Not on file  .  Highest education level: Not on file  Occupational History  . Not on file  Social Needs  . Financial resource strain: Not on file  . Food insecurity    Worry: Not on file    Inability: Not on file  . Transportation needs    Medical: Not on file    Non-medical: Not on file  Tobacco Use  . Smoking status: Former Smoker    Types: Cigarettes    Quit date: 04/10/2010    Years since quitting: 8.4  . Smokeless tobacco: Never Used  Substance and Sexual Activity  . Alcohol use: Yes    Comment: occ  . Drug use: No  . Sexual activity: Yes  Lifestyle  . Physical activity    Days per week: Not on file    Minutes per session: Not on file  . Stress: Not on file  Relationships  . Social Herbalist on phone: Not on file    Gets together: Not on file    Attends religious service: Not on file    Active member of club or organization: Not on file    Attends meetings of clubs or organizations: Not on file    Relationship status: Not on  file  . Intimate partner violence    Fear of current or ex partner: Not on file    Emotionally abused: Not on file    Physically abused: Not on file    Forced sexual activity: Not on file  Other Topics Concern  . Not on file  Social History Narrative   Married, mother of one son.   Stay at home mom.     No Tob, minimal alc.   Exercise: sporadically       Outpatient Medications Prior to Visit  Medication Sig Dispense Refill  . ALPRAZolam (XANAX) 0.25 MG tablet TAKE ONE TABLET TWICE DAILY AS NEEDED FOR ANXIETY. 30 tablet 3  . cyclobenzaprine (FLEXERIL) 5 MG tablet TAKE ONE (1) TABLET BY MOUTH 3 TIMES DAILY AS NEEDED FOR TMJ PAIN 30 tablet 3  . diphenhydrAMINE (BENADRYL) 25 MG tablet Take 50 mg by mouth once as needed for itching or allergies (TAKEN ONCE IN RESPONSE TO ALLERGIC REACTION).    Marland Kitchen levothyroxine (SYNTHROID) 112 MCG tablet Take 1 tablet (112 mcg total) by mouth daily. 90 tablet 3  . montelukast (SINGULAIR) 10 MG tablet Take 1 tablet  (10 mg total) by mouth at bedtime. 90 tablet 3  . cetirizine (ZYRTEC) 10 MG tablet Take 1 tablet (10 mg total) by mouth daily. 90 tablet 3   No facility-administered medications prior to visit.     Allergies  Allergen Reactions  . Bee Venom Itching and Swelling  . Codeine Nausea Only    Review of Systems  Constitutional: Negative for appetite change, chills, fatigue and fever.  HENT: Negative for congestion, dental problem, ear pain and sore throat.   Eyes: Negative for discharge, redness and visual disturbance.  Respiratory: Negative for cough, chest tightness, shortness of breath and wheezing.   Cardiovascular: Negative for chest pain, palpitations and leg swelling.  Gastrointestinal: Negative for abdominal pain, blood in stool, diarrhea, nausea and vomiting.  Genitourinary: Negative for difficulty urinating, dysuria, flank pain, frequency, hematuria and urgency.  Musculoskeletal: Negative for arthralgias, back pain, joint swelling, myalgias and neck stiffness.  Skin: Negative for pallor and rash.  Neurological: Negative for dizziness, speech difficulty, weakness and headaches.  Hematological: Negative for adenopathy. Does not bruise/bleed easily.  Psychiatric/Behavioral: Negative for confusion and sleep disturbance. The patient is not nervous/anxious.     PE; Blood pressure 104/66, pulse 77, temperature 98.2 F (36.8 C), temperature source Temporal, resp. rate 16, height 5' (1.524 m), weight 128 lb 12.8 oz (58.4 kg), SpO2 100 %. Body mass index is 25.15 kg/m. Exam chaperoned by Deveron Furlong, CMA.  Gen: Alert, well appearing.  Patient is oriented to person, place, time, and situation. AFFECT: pleasant, lucid thought and speech. ENT: Ears: EACs clear, normal epithelium.  TMs with good light reflex and landmarks bilaterally.  Eyes: no injection, icteris, swelling, or exudate.  EOMI, PERRLA. Nose: no drainage or turbinate edema/swelling.  No injection or focal lesion.  Mouth:  lips without lesion/swelling.  Oral mucosa pink and moist.  Dentition intact and without obvious caries or gingival swelling.  Oropharynx without erythema, exudate, or swelling.  Neck: supple/nontender.  No LAD, mass, or TM.  Carotid pulses 2+ bilaterally, without bruits. CV: RRR, no m/r/g.   LUNGS: CTA bilat, nonlabored resps, good aeration in all lung fields. ABD: soft, NT, ND, BS normal.  No hepatospenomegaly or mass.  No bruits. EXT: no clubbing, cyanosis, or edema.  Musculoskeletal: no joint swelling, erythema, warmth, or tenderness.  ROM of all joints intact. Skin - no  rashes or color changes.  R hip lateral aspect with slightly pinkish maculopapular rash about 3 cm in size, irreg shape and indistinct borders--this is where she had her tick.   Pertinent labs:  Lab Results  Component Value Date   TSH 0.22 (L) 02/18/2018   Lab Results  Component Value Date   WBC 6.6 10/19/2016   HGB 14.2 10/19/2016   HCT 42.1 10/19/2016   MCV 90.9 10/19/2016   PLT 369.0 10/19/2016   Lab Results  Component Value Date   CREATININE 0.79 10/19/2016   BUN 14 10/19/2016   NA 137 10/19/2016   K 4.3 10/19/2016   CL 103 10/19/2016   CO2 27 10/19/2016   Lab Results  Component Value Date   ALT 8 10/19/2016   AST 15 10/19/2016   ALKPHOS 45 10/19/2016   BILITOT 0.5 10/19/2016   Lab Results  Component Value Date   CHOL 175 10/19/2016   Lab Results  Component Value Date   HDL 56.50 10/19/2016   Lab Results  Component Value Date   LDLCALC 105 (H) 10/19/2016   Lab Results  Component Value Date   TRIG 66.0 10/19/2016   Lab Results  Component Value Date   CHOLHDL 3 10/19/2016   No results found for: HGBA1C  ASSESSMENT AND PLAN:   Health maintenance exam: Reviewed age and gender appropriate health maintenance issues (prudent diet, regular exercise, health risks of tobacco and excessive alcohol, use of seatbelts, fire alarms in home, use of sunscreen).  Also reviewed age and gender  appropriate health screening as well as vaccine recommendations. Vaccines: Tdap due-->given today.   Labs: fasting HP labs. Cervical ca screening: last pap 10/2016, she gets this done via her GYN NP at [facility]. Breast ca screening: has not had one yet, this will be done per GYN--pt to reach out to them. Colon ca screening: average risk patient= as per latest guidelines, start screening at 45-50 yrs of age.  Anxiety-related insomnia, high risk med use (rare). Renew CSC today (xanax). UDS today--most recent xanax dose approx 4-5 d/a.  Recent tick born illness--resolved with doxy. Tick bite site still healing, otherwise fine.  An After Visit Summary was printed and given to the patient.  FOLLOW UP:  Return in about 6 months (around 04/03/2019) for f/u hypothyroidism.  Signed:  Crissie Sickles, MD           10/02/2018

## 2018-10-02 NOTE — Patient Instructions (Signed)

## 2018-10-02 NOTE — Addendum Note (Signed)
Addended by: Tammi Sou on: 10/02/2018 10:40 AM   Modules accepted: Orders

## 2018-10-02 NOTE — Progress Notes (Signed)
tdp

## 2018-10-03 ENCOUNTER — Telehealth: Payer: Self-pay

## 2018-10-03 LAB — PAIN MGMT, PROFILE 8 W/CONF, U
6 Acetylmorphine: NEGATIVE ng/mL
Alcohol Metabolites: NEGATIVE ng/mL (ref <500)
Amphetamines: NEGATIVE ng/mL
Benzodiazepines: NEGATIVE ng/mL
Buprenorphine, Urine: NEGATIVE ng/mL
Cocaine Metabolite: NEGATIVE ng/mL
Creatinine: 35.6 mg/dL
MDMA: NEGATIVE ng/mL
Marijuana Metabolite: NEGATIVE ng/mL
Opiates: NEGATIVE ng/mL
Oxidant: NEGATIVE ug/mL
Oxycodone: NEGATIVE ng/mL
pH: 7 (ref 4.5–9.0)

## 2018-10-03 NOTE — Telephone Encounter (Signed)
MyChart message sent with results.  

## 2018-10-21 ENCOUNTER — Encounter: Payer: Self-pay | Admitting: Family Medicine

## 2018-10-21 ENCOUNTER — Other Ambulatory Visit: Payer: Self-pay

## 2018-10-21 ENCOUNTER — Other Ambulatory Visit: Payer: Self-pay | Admitting: Family Medicine

## 2018-10-21 MED ORDER — MONTELUKAST SODIUM 10 MG PO TABS: 10.0000 mg | ORAL_TABLET | Freq: Every day | ORAL | 3 refills | Status: DC

## 2018-11-22 ENCOUNTER — Other Ambulatory Visit: Payer: BC Managed Care – PPO | Admitting: Adult Health

## 2018-11-22 ENCOUNTER — Other Ambulatory Visit: Payer: Self-pay | Admitting: Family Medicine

## 2018-11-25 NOTE — Telephone Encounter (Signed)
RF request for Xanax 0.25mg  LOV: 10/02/18 Next ov: 04/01/19 Last written: 02/18/2018 #30 x 3 refills  Please advise, thank you.

## 2019-02-04 ENCOUNTER — Other Ambulatory Visit: Payer: Self-pay

## 2019-02-04 ENCOUNTER — Encounter: Payer: Self-pay | Admitting: Adult Health

## 2019-02-04 ENCOUNTER — Ambulatory Visit (INDEPENDENT_AMBULATORY_CARE_PROVIDER_SITE_OTHER): Payer: BC Managed Care – PPO | Admitting: Adult Health

## 2019-02-04 VITALS — BP 105/69 | HR 91 | Ht 60.0 in | Wt 132.0 lb

## 2019-02-04 DIAGNOSIS — N943 Premenstrual tension syndrome: Secondary | ICD-10-CM | POA: Insufficient documentation

## 2019-02-04 DIAGNOSIS — Z1212 Encounter for screening for malignant neoplasm of rectum: Secondary | ICD-10-CM

## 2019-02-04 DIAGNOSIS — Z1211 Encounter for screening for malignant neoplasm of colon: Secondary | ICD-10-CM

## 2019-02-04 DIAGNOSIS — K6289 Other specified diseases of anus and rectum: Secondary | ICD-10-CM

## 2019-02-04 DIAGNOSIS — Z01419 Encounter for gynecological examination (general) (routine) without abnormal findings: Secondary | ICD-10-CM

## 2019-02-04 DIAGNOSIS — R10813 Right lower quadrant abdominal tenderness: Secondary | ICD-10-CM | POA: Insufficient documentation

## 2019-02-04 DIAGNOSIS — D251 Intramural leiomyoma of uterus: Secondary | ICD-10-CM

## 2019-02-04 DIAGNOSIS — N946 Dysmenorrhea, unspecified: Secondary | ICD-10-CM

## 2019-02-04 LAB — HEMOCCULT GUIAC POC 1CARD (OFFICE): Fecal Occult Blood, POC: NEGATIVE

## 2019-02-04 MED ORDER — FLUCONAZOLE 150 MG PO TABS: ORAL_TABLET | ORAL | 6 refills | Status: DC

## 2019-02-04 NOTE — Progress Notes (Signed)
Patient ID: Melissa Mullen, female   DOB: 12-24-1975, 43 y.o.   MRN: [id number] History of Present Illness: Melissa Mullen is a 43 year old white female, married, G1P1, if for a well woman gyn exam, she had a normal pap with negative HPV 10/16/16. She says she feels PMS for about 3 weeks and has full feeling in abdomen and rectal pain and period is heavy 1 day and light about 3, she is not using birth control, just prayer.Has more cramping. 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  PCP is Dr Mickle Plumb.   Current Medications, Allergies, Past Medical History, Past Surgical History, Family History and Social History were reviewed in [employer] record.     Review of Systems: Patient denies any headaches, hearing loss, fatigue, blurred vision, shortness of breath, chest pain, abdominal pain, problems with bowel movements, urination, or intercourse. No joint pain. See HPI for positives.   Physical Exam:BP 105/69 (BP Location: Left Arm, Patient Position: Sitting, Cuff Size: Normal)   Pulse 91   Ht 5' (1.524 m)   Wt 132 lb (59.9 kg)   LMP 01/30/2019 (Exact Date)   BMI 25.78 kg/m  General:  Well developed, well nourished, no acute distress Skin:  Warm and dry Neck:  Midline trachea, thyroid is absent, good ROM, no lymphadenopathy Lungs; Clear to auscultation bilaterally Breast:  No dominant palpable mass, retraction, or nipple discharge Cardiovascular: Regular rate and rhythm Abdomen:  Soft, non tender, no hepatosplenomegaly Pelvic:  External genitalia is normal in appearance, no lesions.  The vagina is normal in appearance. Urethra has no lesions or masses. The cervix is bulbous.  Uterus is felt to be slightly enlarged in size, and irregular shaped.  No adnexal masses, RLQ tenderness noted.Bladder is non tender, no masses felt. Rectal: Good sphincter tone, no polyps, or hemorrhoids felt.  Hemoccult negative. Extremities/musculoskeletal:  No  swelling or varicosities noted, no clubbing or cyanosis Psych:  No mood changes, alert and cooperative,seems happy Fall risk is low PHQ 2 score 0. Examination chaperoned by Rolena Infante LPN  Impression and Plan: 1. Encounter for well woman exam with routine gynecological exam Pap and physical in 1 year Number given for Deneise Lever penn to schedule mammogram  2. Screening for colorectal cancer   3. Right lower quadrant abdominal tenderness without rebound tenderness Will get GYN [place] in 1 week   4. Fibroids, intramural Will get GYN [place] in 1 week   5. Rectal pain   6. Menstrual cramps   7. PMS (premenstrual syndrome) She declines meds, but increase walking and dairy

## 2019-02-12 ENCOUNTER — Ambulatory Visit (INDEPENDENT_AMBULATORY_CARE_PROVIDER_SITE_OTHER): Payer: BC Managed Care – PPO

## 2019-02-12 ENCOUNTER — Other Ambulatory Visit: Payer: Self-pay

## 2019-02-12 DIAGNOSIS — N946 Dysmenorrhea, unspecified: Secondary | ICD-10-CM

## 2019-02-12 DIAGNOSIS — R10813 Right lower quadrant abdominal tenderness: Secondary | ICD-10-CM | POA: Diagnosis not present

## 2019-02-12 DIAGNOSIS — K6289 Other specified diseases of anus and rectum: Secondary | ICD-10-CM | POA: Diagnosis not present

## 2019-02-12 DIAGNOSIS — D251 Intramural leiomyoma of uterus: Secondary | ICD-10-CM

## 2019-02-12 NOTE — Progress Notes (Signed)
PELVIC US TA/TV: heterogeneous anteverted uterus with mult fibroids (#1) anterior right subserosal fibroid 4.9 x 3.1 x 4.1 cm,(#2) mid uterus submucosal fibroid 2 x 2 x 1.9 cm, appears to be distorting endometrium,(#3) fundal subserosal fibroid 2.7 x 1.6 x 2.2 cm,EEC 7.7 mm,simple right ovarian cyst 2.4 x 2 x 2.1 cm,hemorrhagic left ovarian cyst 2.6 x 2.5 x 2.7 cm,no free fluid,no pain during ultrasound,ovaries appear mobile   [employer]

## 2019-02-14 ENCOUNTER — Telehealth: Payer: Self-pay | Admitting: Adult Health

## 2019-02-14 ENCOUNTER — Encounter: Payer: Self-pay | Admitting: Adult Health

## 2019-02-14 DIAGNOSIS — D219 Benign neoplasm of connective and other soft tissue, unspecified: Secondary | ICD-10-CM

## 2019-02-14 NOTE — Telephone Encounter (Signed)
Pt aware that US showed multiple fibroids and right and left ovary cysts, if wants to proceed with surgery call for appt with MD, not interested in po meds to shrink fibroids

## 2019-02-18 ENCOUNTER — Ambulatory Visit: Payer: BC Managed Care – PPO | Admitting: Obstetrics & Gynecology

## 2019-02-18 ENCOUNTER — Other Ambulatory Visit: Payer: Self-pay

## 2019-02-18 ENCOUNTER — Encounter: Payer: Self-pay | Admitting: Obstetrics & Gynecology

## 2019-02-18 VITALS — BP 105/61 | HR 87 | Ht 60.0 in | Wt 129.0 lb

## 2019-02-18 DIAGNOSIS — R102 Pelvic and perineal pain: Secondary | ICD-10-CM | POA: Diagnosis not present

## 2019-02-18 DIAGNOSIS — D219 Benign neoplasm of connective and other soft tissue, unspecified: Secondary | ICD-10-CM

## 2019-02-18 NOTE — Progress Notes (Signed)
Preoperative History and Physical  Melissa Mullen is a 43 y.o. G1P1 with Patient's last menstrual period was 01/30/2019 (exact date). admitted for a abdominal supracervical hysterectomy with removal of Fallopian tubes.  Uterus is enlarged 179 cc 14 weeks size with increasing pelvic pain, bladder pressure, rectal pressure.  PMH:    Past Medical History:  Diagnosis Date  . Anxiety   . Depression   . Encounter for IUD removal 05/11/2014  . Fibroids 02/14/2019  . Fibroids, intramural 10/23/2016  . Hay fever    singulair and zyrtec are helpful  . History of thyroid cancer 2001  . IUD (intrauterine device) in place 11/14/2012   Put in 2011  . Migraine syndrome    very sporadic, triggers unknown  . Nephrolithiasis 2012   No imaging has been done.  Marland Kitchen Postablative hypothyroidism 2002  . TMJ arthralgia    flexeril prn is helpful  . Vaginal Pap smear, abnormal     PSH:     Past Surgical History:  Procedure Laterality Date  . thyroid ablation  2001  . TONSILLECTOMY AND ADENOIDECTOMY  1980s  . WISDOM TOOTH EXTRACTION  1996    POb/GynH:      OB History    Gravida  1   Para  1   Term      Preterm      AB      Living  1     SAB      TAB      Ectopic      Multiple      Live Births  1           SH:   Social History   Tobacco Use  . Smoking status: Former Smoker    Types: Cigarettes    Quit date: 04/10/2010    Years since quitting: 8.8  . Smokeless tobacco: Never Used  Substance Use Topics  . Alcohol use: Yes    Comment: occ  . Drug use: No    FH:    Family History  Problem Relation Age of Onset  . Hyperlipidemia Mother   . Diabetes Father   . Hypertension Father   . Hyperlipidemia Father   . Cancer Paternal Aunt        ovarian  . Cancer Paternal Uncle   . Diabetes Paternal Grandfather   . Hypertension Paternal Grandfather   . Stroke Paternal Grandfather      Allergies:  Allergies  Allergen Reactions  . Bee Venom Itching and Swelling  .  Codeine Nausea Only    Medications:       Current Outpatient Medications:  .  ALPRAZolam (XANAX) 0.25 MG tablet, TAKE ONE TABLET BY MOUTH TWICE DAILY AS NEEDED FOR ANXIETY, Disp: 30 tablet, Rfl: 3 .  cyclobenzaprine (FLEXERIL) 5 MG tablet, TAKE ONE (1) TABLET BY MOUTH 3 TIMES DAILY AS NEEDED FOR TMJ PAIN, Disp: 30 tablet, Rfl: 3 .  diphenhydrAMINE (BENADRYL) 25 MG tablet, Take 50 mg by mouth once as needed for itching or allergies (TAKEN ONCE IN RESPONSE TO ALLERGIC REACTION)., Disp: , Rfl:  .  fluconazole (DIFLUCAN) 150 MG tablet, Take 1 before period if needed, Disp: 2 tablet, Rfl: 6 .  montelukast (SINGULAIR) 10 MG tablet, Take 1 tablet (10 mg total) by mouth at bedtime., Disp: 90 tablet, Rfl: 3 .  SYNTHROID 112 MCG tablet, TAKE 1 TABLET DAILY, Disp: 90 tablet, Rfl: 1  Review of Systems:   Review of Systems  Constitutional: Negative for fever, chills,  weight loss, malaise/fatigue and diaphoresis.  HENT: Negative for hearing loss, ear pain, nosebleeds, congestion, sore throat, neck pain, tinnitus and ear discharge.   Eyes: Negative for blurred vision, double vision, photophobia, pain, discharge and redness.  Respiratory: Negative for cough, hemoptysis, sputum production, shortness of breath, wheezing and stridor.   Cardiovascular: Negative for chest pain, palpitations, orthopnea, claudication, leg swelling and PND.  Gastrointestinal: Positive for abdominal pain. Negative for heartburn, nausea, vomiting, diarrhea, constipation, blood in stool and melena.  Genitourinary: Negative for dysuria, urgency, frequency, hematuria and flank pain.  Musculoskeletal: Negative for myalgias, back pain, joint pain and falls.  Skin: Negative for itching and rash.  Neurological: Negative for dizziness, tingling, tremors, sensory change, speech change, focal weakness, seizures, loss of consciousness, weakness and headaches.  Endo/Heme/Allergies: Negative for environmental allergies and polydipsia. Does not  bruise/bleed easily.  Psychiatric/Behavioral: Negative for depression, suicidal ideas, hallucinations, memory loss and substance abuse. The patient is not nervous/anxious and does not have insomnia.      PHYSICAL EXAM:  Blood pressure 105/61, pulse 87, height 5' (1.524 m), weight 129 lb (58.5 kg), last menstrual period 01/30/2019.    Vitals reviewed. Constitutional: She is oriented to person, place, and time. She appears well-developed and well-nourished.  HENT:  Head: Normocephalic and atraumatic.  Right Ear: External ear normal.  Left Ear: External ear normal.  Nose: Nose normal.  Mouth/Throat: Oropharynx is clear and moist.  Eyes: Conjunctivae and EOM are normal. Pupils are equal, round, and reactive to light. Right eye exhibits no discharge. Left eye exhibits no discharge. No scleral icterus.  Neck: Normal range of motion. Neck supple. No tracheal deviation present. No thyromegaly present.  Cardiovascular: Normal rate, regular rhythm, normal heart sounds and intact distal pulses.  Exam reveals no gallop and no friction rub.   No murmur heard. Respiratory: Effort normal and breath sounds normal. No respiratory distress. She has no wheezes. She has no rales. She exhibits no tenderness.  GI: Soft. Bowel sounds are normal. She exhibits no distension and no mass. There is tenderness. There is no rebound and no guarding.  Genitourinary:       Vulva is normal without lesions Vagina is pink moist without discharge Cervix normal in appearance and pap is normal Uterus is 14 weeks size Adnexa is negative with normal sized ovaries by sonogram  Musculoskeletal: Normal range of motion. She exhibits no edema and no tenderness.  Neurological: She is alert and oriented to person, place, and time. She has normal reflexes. She displays normal reflexes. No cranial nerve deficit. She exhibits normal muscle tone. Coordination normal.  Skin: Skin is warm and dry. No rash noted. No erythema. No pallor.   Psychiatric: She has a normal mood and affect. Her behavior is normal. Judgment and thought content normal.    Labs: No results found for this or any previous visit (from the past 336 hour(s)).  EKG: No orders found for this or any previous visit.  Imaging Studies: US Pelvis Transvaginal Non-ob (tv Only)  Result Date: 02/17/2019 GYNECOLOGIC SONOGRAM LARAE BARAHONA is a 43 y.o. G1P1 LMP10/23/2020 she is here for a pelvic sonogram for hx of fibroids with cramping. Uterus                      8.3 x 6.9 x 6 cm, Total uterine volume 179 cc, heterogeneous anteverted uterus with mult fibroids Endometrium          7.7 mm, symmetrical, appears to be distorted by  fibroid  Right ovary             3.6 x 2.1 x 2.8 cm, simple right ovarian cyst 2.4 x 2 x 2.1 cm Left ovary                2.6 x 2.5 x 2.5 cm, hemorrhagic left ovarian cyst 2.6 x 2.5 x 2.7 cm Fibroids:                   (#1) anterior right subserosal fibroid 4.9 x 3.1 x 4.1 cm,(#2) mid uterus submucosal fibroid 2 x 2 x 1.9 cm, appears to be distorting endometrium,(#3) fundal subserosal fibroid 2.7 x 1.6 x 2.2 cm Technician Comments: PELVIC US TA/TV: heterogeneous anteverted uterus with mult fibroids (#1) anterior right subserosal fibroid 4.9 x 3.1 x 4.1 cm,(#2) mid uterus submucosal fibroid 2 x 2 x 1.9 cm, appears to be distorting endometrium,(#3) fundal subserosal fibroid 2.7 x 1.6 x 2.2 cm,EEC 7.7 mm,simple right ovarian cyst 2.4 x 2 x 2.1 cm,hemorrhagic left ovarian cyst 2.6 x 2.5 x 2.7 cm,no free fluid,no pain during ultrasound,ovaries appear mobile [employer] Heide Guile 02/12/2019 5:02 PM Clinical Impression and recommendations: I have reviewed the sonogram results above, combined with the patient's current clinical course, below are my impressions and any appropriate recommendations for management based on the sonographic findings. Uterus is slightly overall enlarged volume with 3 small fibroids, one of which is submucosal and affecting  the endometrium Endometrium is normal width but with a distorting myoma present Both ovaries with physiologic cysts present Florian Buff, MD 02/17/2019 7:38 AM   US Pelvis (transabdominal Only)  Result Date: 02/17/2019 GYNECOLOGIC SONOGRAM AJAHNAE SALGADO is a 44 y.o. G1P1 LMP10/23/2020 she is here for a pelvic sonogram for hx of fibroids with cramping. Uterus                      8.3 x 6.9 x 6 cm, Total uterine volume 179 cc, heterogeneous anteverted uterus with mult fibroids Endometrium          7.7 mm, symmetrical, appears to be distorted by fibroid  Right ovary             3.6 x 2.1 x 2.8 cm, simple right ovarian cyst 2.4 x 2 x 2.1 cm Left ovary                2.6 x 2.5 x 2.5 cm, hemorrhagic left ovarian cyst 2.6 x 2.5 x 2.7 cm Fibroids:                   (#1) anterior right subserosal fibroid 4.9 x 3.1 x 4.1 cm,(#2) mid uterus submucosal fibroid 2 x 2 x 1.9 cm, appears to be distorting endometrium,(#3) fundal subserosal fibroid 2.7 x 1.6 x 2.2 cm Technician Comments: PELVIC US TA/TV: heterogeneous anteverted uterus with mult fibroids (#1) anterior right subserosal fibroid 4.9 x 3.1 x 4.1 cm,(#2) mid uterus submucosal fibroid 2 x 2 x 1.9 cm, appears to be distorting endometrium,(#3) fundal subserosal fibroid 2.7 x 1.6 x 2.2 cm,EEC 7.7 mm,simple right ovarian cyst 2.4 x 2 x 2.1 cm,hemorrhagic left ovarian cyst 2.6 x 2.5 x 2.7 cm,no free fluid,no pain during ultrasound,ovaries appear mobile [employer] Heide Guile 02/12/2019 5:02 PM Clinical Impression and recommendations: I have reviewed the sonogram results above, combined with the patient's current clinical course, below are my impressions and any appropriate recommendations for management based on the sonographic findings.  Uterus is slightly overall enlarged volume with 3 small fibroids, one of which is submucosal and affecting the endometrium Endometrium is normal width but with a distorting myoma present Both ovaries with physiologic cysts present  Florian Buff, MD 02/17/2019 7:38 AM      Assessment: 14 weeks size fibroid uterus Pelvic pain, increasing  Plan: Abdominal supracervical hysterectomy with removal of Fallopian tubes  Florian Buff 02/18/2019 11:55 AM  Return if symptoms worsen or fail to improve, for pt will decide on surgery timimng,"ball is in her court".     Face to face time:  15 minutes  Greater than 50% of the visit time was spent in counseling and coordination of care with the patient.  The summary and outline of the counseling and care coordination is summarized in the note above.   All questions were answered.

## 2019-02-19 ENCOUNTER — Ambulatory Visit (INDEPENDENT_AMBULATORY_CARE_PROVIDER_SITE_OTHER): Payer: BC Managed Care – PPO

## 2019-02-19 DIAGNOSIS — Z23 Encounter for immunization: Secondary | ICD-10-CM

## 2019-04-01 ENCOUNTER — Other Ambulatory Visit: Payer: Self-pay | Admitting: Family Medicine

## 2019-04-01 ENCOUNTER — Ambulatory Visit: Payer: BC Managed Care – PPO | Admitting: Family Medicine

## 2019-04-01 NOTE — Progress Notes (Deleted)
OFFICE VISIT  04/01/2019   CC: No chief complaint on file.    HPI:    Patient is a 43 y.o. Caucasian female who presents for 6 mo f/u hypothyroidism, anxiety, and TMJ.  Anxiety: has used alprazolam with good results (responsible use) for long term. Uses this infrequently-->a couple times a month average. PMP AWARE reviewed today: most recent rx for *** was filled ***, # ***, rx by ***. No red flags. UDS with appropriate results 09/2018.  Has used flexeril prn for tx of TMJ pain and this has been helpful.  Hypothyroidism: takes T4 correctly.   Past Medical History:  Diagnosis Date  . Anxiety   . Depression   . Encounter for IUD removal 05/11/2014  . Fibroids 02/14/2019  . Fibroids, intramural 10/23/2016  . Hay fever    singulair and zyrtec are helpful  . History of thyroid cancer 2001  . IUD (intrauterine device) in place 11/14/2012   Put in 2011  . Migraine syndrome    very sporadic, triggers unknown  . Nephrolithiasis 2012   No imaging has been done.  Marland Kitchen Postablative hypothyroidism 2002  . TMJ arthralgia    flexeril prn is helpful  . Vaginal Pap smear, abnormal     Past Surgical History:  Procedure Laterality Date  . thyroid ablation  2001  . TONSILLECTOMY AND ADENOIDECTOMY  1980s  . [place]    Outpatient Medications Prior to Visit  Medication Sig Dispense Refill  . ALPRAZolam (XANAX) 0.25 MG tablet TAKE ONE TABLET BY MOUTH TWICE DAILY AS NEEDED FOR ANXIETY 30 tablet 3  . cyclobenzaprine (FLEXERIL) 5 MG tablet TAKE ONE (1) TABLET BY MOUTH 3 TIMES DAILY AS NEEDED FOR TMJ PAIN 30 tablet 3  . diphenhydrAMINE (BENADRYL) 25 MG tablet Take 50 mg by mouth once as needed for itching or allergies (TAKEN ONCE IN RESPONSE TO ALLERGIC REACTION).    . fluconazole (DIFLUCAN) 150 MG tablet Take 1 before period if needed 2 tablet 6  . montelukast (SINGULAIR) 10 MG tablet Take 1 tablet (10 mg total) by mouth at bedtime. 90 tablet 3  . SYNTHROID 112 MCG tablet  TAKE 1 TABLET DAILY 90 tablet 1   No facility-administered medications prior to visit.    Allergies  Allergen Reactions  . Bee Venom Itching and Swelling  . Codeine Nausea Only    ROS As per HPI  PE: There were no vitals taken for this visit. ***  LABS:  Lab Results  Component Value Date   TSH 1.02 10/02/2018   Lab Results  Component Value Date   WBC 6.1 10/02/2018   HGB 13.5 10/02/2018   HCT 40.4 10/02/2018   MCV 91.9 10/02/2018   PLT 341.0 10/02/2018   Lab Results  Component Value Date   CREATININE 0.64 10/02/2018   BUN 15 10/02/2018   NA 139 10/02/2018   K 4.1 10/02/2018   CL 105 10/02/2018   CO2 28 10/02/2018   Lab Results  Component Value Date   ALT 8 10/02/2018   AST 13 10/02/2018   ALKPHOS 38 (L) 10/02/2018   BILITOT 0.5 10/02/2018   Lab Results  Component Value Date   CHOL 176 10/02/2018   Lab Results  Component Value Date   HDL 58.10 10/02/2018   Lab Results  Component Value Date   LDLCALC 103 (H) 10/02/2018   Lab Results  Component Value Date   TRIG 73.0 10/02/2018   Lab Results  Component Value Date   CHOLHDL  3 10/02/2018    IMPRESSION AND PLAN:  No problem-specific Assessment & Plan notes found for this encounter.   An After Visit Summary was printed and given to the patient.  FOLLOW UP: No follow-ups on file.  Signed:  Crissie Sickles, MD           04/01/2019

## 2019-05-01 ENCOUNTER — Other Ambulatory Visit: Payer: Self-pay | Admitting: Adult Health

## 2019-05-01 MED ORDER — METRONIDAZOLE 500 MG PO TABS: 500.0000 mg | ORAL_TABLET | Freq: Two times a day (BID) | ORAL | 0 refills | Status: DC

## 2019-05-01 NOTE — Progress Notes (Signed)
Will rx flagyl for suspected BV

## 2019-05-19 ENCOUNTER — Other Ambulatory Visit: Payer: Self-pay | Admitting: Family Medicine

## 2019-05-19 NOTE — Telephone Encounter (Signed)
LM for pt to returncall

## 2019-05-20 NOTE — Telephone Encounter (Signed)
Pt responded via MyChart regarding medication that she still takes this but as needed only. Will send RF

## 2019-06-04 ENCOUNTER — Other Ambulatory Visit: Payer: Self-pay

## 2019-06-05 ENCOUNTER — Ambulatory Visit (INDEPENDENT_AMBULATORY_CARE_PROVIDER_SITE_OTHER): Payer: BC Managed Care – PPO | Admitting: Family Medicine

## 2019-06-05 ENCOUNTER — Encounter: Payer: Self-pay | Admitting: Family Medicine

## 2019-06-05 VITALS — BP 114/70 | HR 78 | Temp 98.3°F | Ht 60.0 in | Wt 135.0 lb

## 2019-06-05 DIAGNOSIS — F411 Generalized anxiety disorder: Secondary | ICD-10-CM

## 2019-06-05 DIAGNOSIS — E890 Postprocedural hypothyroidism: Secondary | ICD-10-CM

## 2019-06-05 DIAGNOSIS — R5382 Chronic fatigue, unspecified: Secondary | ICD-10-CM | POA: Diagnosis not present

## 2019-06-05 MED ORDER — LEVOTHYROXINE SODIUM 100 MCG PO TABS: 100.0000 ug | ORAL_TABLET | Freq: Every day | ORAL | 1 refills | Status: DC

## 2019-06-05 NOTE — Progress Notes (Signed)
OFFICE VISIT  06/05/2019   CC: f/u chronic illness  HPI:    Patient is a 44 y.o. Caucasian female who presents for 6 mo f/u hypothyroidism and anxiety. TSH has been at the lowest end of normal for the last 3 checks, most recent 09/2018.  No dose changes were made during this time period.  No changes in the way she is feeling, generally well other than being tired all the time. She wonders if her thyroid level being borderline high could be making her feel this way.   No palpitations, diarrhea/constipation, skin changes, cognitive slowing, or HAs.  Has used low dose alprazolam on prn basis long term and this has been helpful. Has consistently used this med responsibly and sparingly. PMP AWARE reviewed today: most recent rx for alprazolam 0.25mg  was filled 05/19/19, # 30, rx by me. No red flags.   Past Medical History:  Diagnosis Date  . Anxiety   . Depression   . Encounter for IUD removal 05/11/2014  . Fibroids 02/14/2019  . Fibroids, intramural 10/23/2016  . Hay fever    singulair and zyrtec are helpful  . History of thyroid cancer 2001  . IUD (intrauterine device) in place 11/14/2012   Put in 2011  . Migraine syndrome    very sporadic, triggers unknown  . Nephrolithiasis 2012   No imaging has been done.  Marland Kitchen Postablative hypothyroidism 2002  . TMJ arthralgia    flexeril prn is helpful  . Vaginal Pap smear, abnormal     Past Surgical History:  Procedure Laterality Date  . thyroid ablation  2001  . TONSILLECTOMY AND ADENOIDECTOMY  1980s  . [place]    Outpatient Medications Prior to Visit  Medication Sig Dispense Refill  . ALPRAZolam (XANAX) 0.25 MG tablet TAKE ONE TABLET BY MOUTH TWICE DAILY AS NEEDED FOR ANXIETY 30 tablet 3  . cyclobenzaprine (FLEXERIL) 5 MG tablet TAKE ONE TABLET BY MOUTH THREE TIMES A DAY AS NEEDED FOR TMJ PAIN 30 tablet 3  . diphenhydrAMINE (BENADRYL) 25 MG tablet Take 50 mg by mouth once as needed for itching or allergies (TAKEN  ONCE IN RESPONSE TO ALLERGIC REACTION).    . fluconazole (DIFLUCAN) 150 MG tablet Take 1 before period if needed 2 tablet 6  . montelukast (SINGULAIR) 10 MG tablet Take 1 tablet (10 mg total) by mouth at bedtime. 90 tablet 3  . SYNTHROID 112 MCG tablet TAKE 1 TABLET DAILY 90 tablet 0  . metroNIDAZOLE (FLAGYL) 500 MG tablet Take 1 tablet (500 mg total) by mouth 2 (two) times daily. (Patient not taking: Reported on 06/05/2019) 14 tablet 0   No facility-administered medications prior to visit.    Allergies  Allergen Reactions  . Bee Venom Itching and Swelling  . Codeine Nausea Only    ROS As per HPI  PE: Blood pressure 114/70, pulse 78, temperature 98.3 F (36.8 C), temperature source Oral, height 5' (1.524 m), weight 135 lb (61.2 kg), SpO2 100 %. Body mass index is 26.37 kg/m.  Gen: Alert, well appearing.  Patient is oriented to person, place, time, and situation. AFFECT: pleasant, lucid thought and speech. No further exam today.  LABS:  Lab Results  Component Value Date   TSH 1.02 10/02/2018   Lab Results  Component Value Date   WBC 6.1 10/02/2018   HGB 13.5 10/02/2018   HCT 40.4 10/02/2018   MCV 91.9 10/02/2018   PLT 341.0 10/02/2018   Lab Results  Component Value Date  CREATININE 0.64 10/02/2018   BUN 15 10/02/2018   NA 139 10/02/2018   K 4.1 10/02/2018   CL 105 10/02/2018   CO2 28 10/02/2018   Lab Results  Component Value Date   ALT 8 10/02/2018   AST 13 10/02/2018   ALKPHOS 38 (L) 10/02/2018   BILITOT 0.5 10/02/2018   Lab Results  Component Value Date   CHOL 176 10/02/2018   Lab Results  Component Value Date   HDL 58.10 10/02/2018   Lab Results  Component Value Date   LDLCALC 103 (H) 10/02/2018   Lab Results  Component Value Date   TRIG 73.0 10/02/2018   Lab Results  Component Value Date   CHOLHDL 3 10/02/2018    IMPRESSION AND PLAN:  1) Post-ablative hypothyroidism. Chronic fatigue in this setting-->? As a result of mild  over-supplementation. Will do trial of decrease of dose to 100 mcg if TSH recheck today is <1.5. If we do this then we'll recheck TSH after she has been on this lower dose for 6-8 wks. She will communicate via MyChart about how she is feeling in the meantime.  2) Anxiety: stable.  No changes in med at this time.  An After Visit Summary was printed and given to the patient.  FOLLOW UP: Return in about 6 months (around 12/03/2019) for annual CPE (fasting) in person.  Lab visit in 6-8 wks (non-fasting)..  Signed:  Crissie Sickles, MD           06/05/2019

## 2019-06-06 LAB — TSH: TSH: 0.79 mIU/L

## 2019-06-09 ENCOUNTER — Encounter: Payer: Self-pay | Admitting: Family Medicine

## 2019-08-13 ENCOUNTER — Other Ambulatory Visit: Payer: Self-pay

## 2019-08-13 ENCOUNTER — Ambulatory Visit (INDEPENDENT_AMBULATORY_CARE_PROVIDER_SITE_OTHER): Payer: BC Managed Care – PPO | Admitting: Family Medicine

## 2019-08-13 ENCOUNTER — Encounter: Payer: Self-pay | Admitting: Family Medicine

## 2019-08-13 VITALS — BP 114/64 | HR 89 | Temp 98.0°F | Resp 16 | Ht 60.0 in | Wt 134.0 lb

## 2019-08-13 DIAGNOSIS — E890 Postprocedural hypothyroidism: Secondary | ICD-10-CM

## 2019-08-13 NOTE — Progress Notes (Signed)
OFFICE VISIT  08/13/2019   CC:  Chief Complaint  Patient presents with  . Follow-up    thyroid, during last labs dose was adjusted and she would like to have level rechecked.   HPI:    Patient is a 44 y.o. Caucasian female who presents for 2 and 1/2 mo f/u post-ablative hypothyroidism. Last TSH was 0.79.  She was suffering from fatigue.  We thought possibly her fatigue (parodoxically) was the result of slight oversuppression of TSH. Decreased dose from 112  To 100 mcg qd at that time.  INTERIM HX: Feeling essentially the same level of fatigue on 100 mcg qd dose. Possibly feeling a little less anxious on this dose compared to 112 qd dose. No new symptoms or complaints. Not exercising, says diet is not good.  She is busy constantly. She knows she doesn't take care of herself very well. She is fine from a mood standpoint.   Past Medical History:  Diagnosis Date  . Anxiety   . Depression   . Encounter for IUD removal 05/11/2014  . Fibroids 02/14/2019  . Fibroids, intramural 10/23/2016  . Hay fever    singulair and zyrtec are helpful  . History of thyroid cancer 2001  . IUD (intrauterine device) in place 11/14/2012   Put in 2011  . Migraine syndrome    very sporadic, triggers unknown  . Nephrolithiasis 2012   No imaging has been done.  Marland Kitchen Postablative hypothyroidism 2002  . TMJ arthralgia    flexeril prn is helpful  . Vaginal Pap smear, abnormal     Past Surgical History:  Procedure Laterality Date  . thyroid ablation  2001  . TONSILLECTOMY AND ADENOIDECTOMY  1980s  . [place]    Outpatient Medications Prior to Visit  Medication Sig Dispense Refill  . levothyroxine (SYNTHROID) 100 MCG tablet Take 1 tablet (100 mcg total) by mouth daily. 30 tablet 1  . montelukast (SINGULAIR) 10 MG tablet Take 1 tablet (10 mg total) by mouth at bedtime. 90 tablet 3  . ALPRAZolam (XANAX) 0.25 MG tablet TAKE ONE TABLET BY MOUTH TWICE DAILY AS NEEDED FOR ANXIETY (Patient  not taking: Reported on 08/13/2019) 30 tablet 3  . cyclobenzaprine (FLEXERIL) 5 MG tablet TAKE ONE TABLET BY MOUTH THREE TIMES A DAY AS NEEDED FOR TMJ PAIN (Patient not taking: Reported on 08/13/2019) 30 tablet 3  . diphenhydrAMINE (BENADRYL) 25 MG tablet Take 50 mg by mouth once as needed for itching or allergies (TAKEN ONCE IN RESPONSE TO ALLERGIC REACTION).    . fluconazole (DIFLUCAN) 150 MG tablet Take 1 before period if needed (Patient not taking: Reported on 08/13/2019) 2 tablet 6   No facility-administered medications prior to visit.    Allergies  Allergen Reactions  . Bee Venom Itching and Swelling  . Codeine Nausea Only    ROS As per HPI  PE: Blood pressure 114/64, pulse 89, temperature 98 F (36.7 C), temperature source Temporal, resp. rate 16, height 5' (1.524 m), weight 134 lb (60.8 kg), SpO2 99 %. Gen: Alert, well appearing.  Patient is oriented to person, place, time, and situation. AFFECT: pleasant, lucid thought and speech. No further exam today.  LABS:  Lab Results  Component Value Date   TSH 0.79 06/05/2019   Lab Results  Component Value Date   WBC 6.1 10/02/2018   HGB 13.5 10/02/2018   HCT 40.4 10/02/2018   MCV 91.9 10/02/2018   PLT 341.0 10/02/2018   Lab Results  Component Value Date  [id number] 02/03/2014      Chemistry      Component Value Date/Time   NA 139 10/02/2018 1034   K 4.1 10/02/2018 1034   CL 105 10/02/2018 1034   CO2 28 10/02/2018 1034   BUN 15 10/02/2018 1034   CREATININE 0.64 10/02/2018 1034      Component Value Date/Time   CALCIUM 9.4 10/02/2018 1034   ALKPHOS 38 (L) 10/02/2018 1034   AST 13 10/02/2018 1034   ALT 8 10/02/2018 1034   BILITOT 0.5 10/02/2018 1034      IMPRESSION AND PLAN:  Hypothyroidism, postablative. Recent trial of lower dose (172mcg qd) T4 to see if some of pt's fatigue was from oversuppression of TSH. Slight improvement in anxiety level on lower dose, otherwise no change. TSH recheck today and if  less than or equal to 2 then will continue current T4 dosing>100 mcg qd. Needs RF after today's results come in (90d).  Chronic fatigue syndrome: she'll try to improve diet and start exercising.  An After Visit Summary was printed and given to the patient.  FOLLOW UP: Return in about 6 months (around 02/13/2020) for annual CPE (fasting), call or return earlier if needed.  Signed:  Crissie Sickles, MD           08/13/2019

## 2019-08-14 ENCOUNTER — Other Ambulatory Visit: Payer: Self-pay | Admitting: Family Medicine

## 2019-08-14 LAB — TSH: TSH: 1.96 u[IU]/mL (ref 0.35–4.50)

## 2019-08-14 MED ORDER — LEVOTHYROXINE SODIUM 100 MCG PO TABS: 100.0000 ug | ORAL_TABLET | Freq: Every day | ORAL | 3 refills | Status: DC

## 2019-08-15 ENCOUNTER — Other Ambulatory Visit: Payer: Self-pay | Admitting: Family Medicine

## 2019-08-15 MED ORDER — LEVOTHYROXINE SODIUM 100 MCG PO TABS: 100.0000 ug | ORAL_TABLET | Freq: Every day | ORAL | 3 refills | Status: DC

## 2019-09-25 ENCOUNTER — Encounter: Payer: Self-pay | Admitting: Family Medicine

## 2019-09-29 ENCOUNTER — Other Ambulatory Visit: Payer: Self-pay | Admitting: Family Medicine

## 2019-09-29 MED ORDER — LEVOTHYROXINE SODIUM 100 MCG PO TABS: 100.0000 ug | ORAL_TABLET | Freq: Every day | ORAL | 2 refills | Status: DC

## 2019-09-29 NOTE — Progress Notes (Signed)
Patient requesting her levothyroxine to be sent to [facility] mail order pharmacy instead of [facility].  Rx sent # 90 x 2 refills ( as the last RX was sent minus 1 refill ) and [facility] called to cancel remaining refills.

## 2019-10-31 ENCOUNTER — Other Ambulatory Visit: Payer: Self-pay | Admitting: Family Medicine

## 2019-12-04 ENCOUNTER — Encounter: Payer: Self-pay | Admitting: Family Medicine

## 2019-12-04 NOTE — Telephone Encounter (Signed)
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 She would like Auvi-Q epi pen but no epi pen currently on med list. Please advise if refill appropriate, thanks

## 2019-12-05 MED ORDER — EPINEPHRINE 0.3 MG/0.3ML IJ SOAJ: 0.3000 mg | INTRAMUSCULAR | 1 refills | Status: DC | PRN

## 2019-12-05 NOTE — Telephone Encounter (Signed)
Auvi-Q eRx'd

## 2019-12-08 ENCOUNTER — Other Ambulatory Visit: Payer: Self-pay | Admitting: Family Medicine

## 2019-12-08 NOTE — Telephone Encounter (Signed)
RF request for alprazolam Last OV 08/13/19. No upcoming appt. Last RX 11/25/2018 # 30 x 3 rfs.  Please advise.

## 2020-02-19 ENCOUNTER — Ambulatory Visit: Payer: BC Managed Care – PPO

## 2020-03-19 ENCOUNTER — Encounter: Payer: Self-pay | Admitting: Adult Health

## 2020-04-13 ENCOUNTER — Encounter: Payer: BC Managed Care – PPO | Admitting: Family Medicine

## 2020-04-21 ENCOUNTER — Other Ambulatory Visit: Payer: BC Managed Care – PPO | Admitting: Adult Health

## 2020-04-29 ENCOUNTER — Encounter: Payer: BC Managed Care – PPO | Admitting: Family Medicine

## 2020-06-09 ENCOUNTER — Other Ambulatory Visit: Payer: Self-pay

## 2020-06-10 ENCOUNTER — Encounter: Payer: Self-pay | Admitting: Family Medicine

## 2020-06-10 ENCOUNTER — Ambulatory Visit (INDEPENDENT_AMBULATORY_CARE_PROVIDER_SITE_OTHER): Payer: BC Managed Care – PPO | Admitting: Family Medicine

## 2020-06-10 VITALS — BP 111/70 | HR 82 | Temp 97.8°F | Resp 16 | Ht 60.0 in | Wt 132.0 lb

## 2020-06-10 DIAGNOSIS — Z1231 Encounter for screening mammogram for malignant neoplasm of breast: Secondary | ICD-10-CM | POA: Diagnosis not present

## 2020-06-10 DIAGNOSIS — Z79899 Other long term (current) drug therapy: Secondary | ICD-10-CM | POA: Diagnosis not present

## 2020-06-10 DIAGNOSIS — E890 Postprocedural hypothyroidism: Secondary | ICD-10-CM | POA: Diagnosis not present

## 2020-06-10 DIAGNOSIS — F411 Generalized anxiety disorder: Secondary | ICD-10-CM

## 2020-06-10 DIAGNOSIS — Z0000 Encounter for general adult medical examination without abnormal findings: Secondary | ICD-10-CM

## 2020-06-10 DIAGNOSIS — Z85850 Personal history of malignant neoplasm of thyroid: Secondary | ICD-10-CM

## 2020-06-10 LAB — CBC WITH DIFFERENTIAL/PLATELET
Basophils Absolute: 0.1 10*3/uL (ref 0.0–0.1)
Basophils Relative: 1.3 % (ref 0.0–3.0)
Eosinophils Absolute: 0.1 10*3/uL (ref 0.0–0.7)
Eosinophils Relative: 1 % (ref 0.0–5.0)
HCT: 42.2 % (ref 36.0–46.0)
Hemoglobin: 14.2 g/dL (ref 12.0–15.0)
Lymphocytes Relative: 30.8 % (ref 12.0–46.0)
Lymphs Abs: 1.8 10*3/uL (ref 0.7–4.0)
MCHC: 33.7 g/dL (ref 30.0–36.0)
MCV: 90.5 fl (ref 78.0–100.0)
Monocytes Absolute: 0.7 10*3/uL (ref 0.1–1.0)
Monocytes Relative: 11.2 % (ref 3.0–12.0)
Neutro Abs: 3.3 10*3/uL (ref 1.4–7.7)
Neutrophils Relative %: 55.7 % (ref 43.0–77.0)
Platelets: 383 10*3/uL (ref 150.0–400.0)
RBC: 4.66 Mil/uL (ref 3.87–5.11)
RDW: 14.5 % (ref 11.5–15.5)
WBC: 5.9 10*3/uL (ref 4.0–10.5)

## 2020-06-10 LAB — LIPID PANEL
Cholesterol: 178 mg/dL (ref 0–200)
HDL: 59.8 mg/dL (ref >39.00)
LDL Cholesterol: 102 mg/dL — ABNORMAL HIGH (ref 0–99)
NonHDL: 118.52
Total CHOL/HDL Ratio: 3
Triglycerides: 83 mg/dL (ref 0.0–149.0)
VLDL: 16.6 mg/dL (ref 0.0–40.0)

## 2020-06-10 LAB — COMPREHENSIVE METABOLIC PANEL
ALT: 11 U/L (ref 0–35)
AST: 16 U/L (ref 0–37)
Albumin: 4.5 g/dL (ref 3.5–5.2)
Alkaline Phosphatase: 43 U/L (ref 39–117)
BUN: 18 mg/dL (ref 6–23)
CO2: 28 mEq/L (ref 19–32)
Calcium: 9.6 mg/dL (ref 8.4–10.5)
Chloride: 104 mEq/L (ref 96–112)
Creatinine, Ser: 0.68 mg/dL (ref 0.40–1.20)
GFR: 105.84 mL/min (ref >60.00)
Glucose, Bld: 94 mg/dL (ref 70–99)
Potassium: 4 mEq/L (ref 3.5–5.1)
Sodium: 138 mEq/L (ref 135–145)
Total Bilirubin: 0.4 mg/dL (ref 0.2–1.2)
Total Protein: 7.2 g/dL (ref 6.0–8.3)

## 2020-06-10 LAB — TSH: TSH: 4.44 u[IU]/mL (ref 0.35–4.50)

## 2020-06-10 MED ORDER — ALPRAZOLAM 0.25 MG PO TABS: ORAL_TABLET | ORAL | 2 refills | Status: DC

## 2020-06-10 MED ORDER — CITALOPRAM HYDROBROMIDE 20 MG PO TABS: 20.0000 mg | ORAL_TABLET | Freq: Every day | ORAL | 1 refills | Status: DC

## 2020-06-10 MED ORDER — CYCLOBENZAPRINE HCL 5 MG PO TABS: ORAL_TABLET | ORAL | 3 refills | Status: DC

## 2020-06-10 NOTE — Progress Notes (Signed)
Office Note 06/10/2020  CC:  Chief Complaint  Patient presents with  . Annual Exam    Pt is fasting    HPI:  Melissa Mullen is a 45 y.o. White female who is here for annual health maintenance exam and f/u anxiety and hypothyroidism. A/P as of last visit---approx 10 mo ago: "Hypothyroidism, postablative. Recent trial of lower dose (179mcg qd) T4 to see if some of pt's fatigue was from oversuppression of TSH. Slight improvement in anxiety level on lower dose, otherwise no change. TSH recheck today and if less than or equal to 2 then will continue current T4 dosing>100 mcg qd. Needs RF after today's results come in (90d)."  INTERIM HX: Fighting anx/dep more lately, esp with death of father last year. Says she is tired of fighting chronic worry, says many days it drives mood down, wants to try some med for this.  Maintaining good day to day function still. No SI or HI.  No panic attacks.  Energy level decent.     Hypoth: we continued her on 100 mcg synthroid qd last visit.  Anxiety: see above; takes alpraz avg once a week. PMP AWARE reviewed today: most recent rx for alpraz 0.25mg  was filled 12/08/19, # 30, rx by me. No red flags.  Past Medical History:  Diagnosis Date  . Anxiety and depression   . Encounter for IUD removal 05/11/2014  . Fibroids, intramural 10/23/2016  . Hay fever    singulair and zyrtec are helpful  . History of thyroid cancer 2001  . IUD (intrauterine device) in place 11/14/2012   Put in 2011  . Migraine syndrome    very sporadic, triggers unknown  . Nephrolithiasis 2012   No imaging has been done.  Marland Kitchen Postablative hypothyroidism 2002  . TMJ arthralgia    flexeril prn is helpful  . Vaginal Pap smear, abnormal     Past Surgical History:  Procedure Laterality Date  . thyroid ablation  2001  . TONSILLECTOMY AND ADENOIDECTOMY  1980s  . WISDOM TOOTH EXTRACTION  1996    Family History  Problem Relation Age of Onset  . Hyperlipidemia Mother   .  Diabetes Father   . Hypertension Father   . Hyperlipidemia Father   . Cancer Paternal Aunt        ovarian  . Cancer Paternal Uncle   . Diabetes Paternal Grandfather   . Hypertension Paternal Grandfather   . Stroke Paternal Grandfather     Social History   Socioeconomic History  . Marital status: Married    Spouse name: Not on file  . Number of children: Not on file  . Years of education: Not on file  . Highest education level: Not on file  Occupational History  . Not on file  Tobacco Use  . Smoking status: Former Smoker    Types: Cigarettes    Quit date: 04/10/2010    Years since quitting: 10.1  . Smokeless tobacco: Never Used  Substance and Sexual Activity  . Alcohol use: Yes    Comment: occ  . Drug use: No  . Sexual activity: Yes    Birth control/protection: None  Other Topics Concern  . Not on file  Social History Narrative   Married, mother of one son.   Stay at home mom.     No Tob, minimal alc.   Exercise: sporadically      Social Determinants of Health   Financial Resource Strain: Not on file  Food Insecurity: Not on file  Transportation Needs: Not on file  Physical Activity: Not on file  Stress: Not on file  Social Connections: Not on file  Intimate Partner Violence: Not on file    Outpatient Medications Prior to Visit  Medication Sig Dispense Refill  . ALPRAZolam (XANAX) 0.25 MG tablet TAKE 1 TABLET BY MOUTH TWICE DAILY AS NEEDED FOR ANXIETY 30 tablet 2  . cyclobenzaprine (FLEXERIL) 5 MG tablet TAKE ONE TABLET BY MOUTH THREE TIMES A DAY AS NEEDED FOR TMJ PAIN 30 tablet 3  . levothyroxine (SYNTHROID) 100 MCG tablet Take 1 tablet (100 mcg total) by mouth daily. 90 tablet 2  . montelukast (SINGULAIR) 10 MG tablet TAKE 1 TABLET AT BEDTIME. 90 tablet 3  . diphenhydrAMINE (BENADRYL) 25 MG tablet Take 50 mg by mouth once as needed for itching or allergies (TAKEN ONCE IN RESPONSE TO ALLERGIC REACTION). (Patient not taking: Reported on 06/10/2020)    .  EPINEPHrine (AUVI-Q) 0.3 mg/0.3 mL IJ SOAJ injection Inject 0.3 mLs (0.3 mg total) into the muscle as needed for anaphylaxis. (Patient not taking: Reported on 06/10/2020) 2 each 1  . fluconazole (DIFLUCAN) 150 MG tablet Take 1 before period if needed (Patient not taking: No sig reported) 2 tablet 6   No facility-administered medications prior to visit.    Allergies  Allergen Reactions  . Bee Venom Itching and Swelling  . Codeine Nausea Only    ROS Review of Systems  Constitutional: Negative for appetite change, chills, fatigue and fever.  HENT: Negative for congestion, dental problem, ear pain and sore throat.   Eyes: Negative for discharge, redness and visual disturbance.  Respiratory: Negative for cough, chest tightness, shortness of breath and wheezing.   Cardiovascular: Negative for chest pain, palpitations and leg swelling.  Gastrointestinal: Negative for abdominal pain, blood in stool, diarrhea, nausea and vomiting.  Genitourinary: Negative for difficulty urinating, dysuria, flank pain, frequency, hematuria and urgency.  Musculoskeletal: Negative for arthralgias, back pain, joint swelling, myalgias and neck stiffness.  Skin: Negative for pallor and rash.  Neurological: Negative for dizziness, speech difficulty, weakness and headaches.  Hematological: Negative for adenopathy. Does not bruise/bleed easily.  Psychiatric/Behavioral: Positive for dysphoric mood. Negative for confusion, sleep disturbance and suicidal ideas. The patient is nervous/anxious.     PE; Vitals with BMI 06/10/2020 08/13/2019 06/05/2019  Height 5\' 0"  5\' 0"  5\' 0"   Weight 132 lbs 134 lbs 135 lbs  BMI 25.78 63.78 58.85  Systolic 027 741 287  Diastolic 70 64 70  Pulse 82 89 78   Exam chaperoned by Shepard General, CMA  Gen: Alert, well appearing.  Patient is oriented to person, place, time, and situation. AFFECT: pleasant, lucid thought and speech. ENT: Ears: EACs clear, normal epithelium.  TMs with good light  reflex and landmarks bilaterally.  Eyes: no injection, icteris, swelling, or exudate.  EOMI, PERRLA. Nose: no drainage or turbinate edema/swelling.  No injection or focal lesion.  Mouth: lips without lesion/swelling.  Oral mucosa pink and moist.  Dentition intact and without obvious caries or gingival swelling.  Oropharynx without erythema, exudate, or swelling.  Neck: supple/nontender.  No LAD, mass, or TM.  Carotid pulses 2+ bilaterally, without bruits. CV: RRR, no m/r/g.   LUNGS: CTA bilat, nonlabored resps, good aeration in all lung fields. ABD: soft, NT, ND, BS normal.  No hepatospenomegaly or mass.  No bruits. EXT: no clubbing, cyanosis, or edema.  Musculoskeletal: no joint swelling, erythema, warmth, or tenderness.  ROM of all joints intact. Skin - no sores or suspicious lesions  or rashes or color changes   Pertinent labs:  Lab Results  Component Value Date   TSH 1.96 08/13/2019   Lab Results  Component Value Date   WBC 6.1 10/02/2018   HGB 13.5 10/02/2018   HCT 40.4 10/02/2018   MCV 91.9 10/02/2018   PLT 341.0 10/02/2018   Lab Results  Component Value Date   CREATININE 0.64 10/02/2018   BUN 15 10/02/2018   NA 139 10/02/2018   K 4.1 10/02/2018   CL 105 10/02/2018   CO2 28 10/02/2018   Lab Results  Component Value Date   ALT 8 10/02/2018   AST 13 10/02/2018   ALKPHOS 38 (L) 10/02/2018   BILITOT 0.5 10/02/2018   Lab Results  Component Value Date   CHOL 176 10/02/2018   Lab Results  Component Value Date   HDL 58.10 10/02/2018   Lab Results  Component Value Date   LDLCALC 103 (H) 10/02/2018   Lab Results  Component Value Date   TRIG 73.0 10/02/2018   Lab Results  Component Value Date   CHOLHDL 3 10/02/2018    ASSESSMENT AND PLAN:   1) Postablative hypothyroidism: cont synthroid 100 mcg qd. TSH today.  2) GAD, with recent superimposed adjustment d/o with anx/dep mood--->pt ready to try daily antidepressant.  Start citalopram 20mg  qd.  Cont alpraz  0.25mg  bid prn. Renewed CSC today, UDS today.  3) Health maintenance exam: Reviewed age and gender appropriate health maintenance issues (prudent diet, regular exercise, health risks of tobacco and excessive alcohol, use of seatbelts, fire alarms in home, use of sunscreen).  Also reviewed age and gender appropriate health screening as well as vaccine recommendations. Vaccines: Pt declined flu and covid.  Tdap UTD. Labs: fasting HP ordered. Cervical ca screening: UTD, plan for GYN rpt 08/2020. Breast ca screening: pt to schedule her mammogram. Colon ca screening: average risk patient= as per latest guidelines, start screening at 94 yrs of age.  An After Visit Summary was printed and given to the patient.  FOLLOW UP:  No follow-ups on file.  Signed:  Crissie Sickles, MD           06/10/2020

## 2020-06-10 NOTE — Patient Instructions (Signed)
Health Maintenance, Female Adopting a healthy lifestyle and getting preventive care are important in promoting health and wellness. Ask your health care provider about:  The right schedule for you to have regular tests and exams.  Things you can do on your own to prevent diseases and keep yourself healthy. What should I know about diet, weight, and exercise? Eat a healthy diet  Eat a diet that includes plenty of vegetables, fruits, low-fat dairy products, and lean protein.  Do not eat a lot of foods that are high in solid fats, added sugars, or sodium.   Maintain a healthy weight Body mass index (BMI) is used to identify weight problems. It estimates body fat based on height and weight. Your health care provider can help determine your BMI and help you achieve or maintain a healthy weight. Get regular exercise Get regular exercise. This is one of the most important things you can do for your health. Most adults should:  Exercise for at least 150 minutes each week. The exercise should increase your heart rate and make you sweat (moderate-intensity exercise).  Do strengthening exercises at least twice a week. This is in addition to the moderate-intensity exercise.  Spend less time sitting. Even light physical activity can be beneficial. Watch cholesterol and blood lipids Have your blood tested for lipids and cholesterol at 45 years of age, then have this test every 5 years. Have your cholesterol levels checked more often if:  Your lipid or cholesterol levels are high.  You are older than 45 years of age.  You are at high risk for heart disease. What should I know about cancer screening? Depending on your health history and family history, you may need to have cancer screening at various ages. This may include screening for:  Breast cancer.  Cervical cancer.  Colorectal cancer.  Skin cancer.  Lung cancer. What should I know about heart disease, diabetes, and high blood  pressure? Blood pressure and heart disease  High blood pressure causes heart disease and increases the risk of stroke. This is more likely to develop in people who have high blood pressure readings, are of African descent, or are overweight.  Have your blood pressure checked: ? Every 3-5 years if you are 18-39 years of age. ? Every year if you are 40 years old or older. Diabetes Have regular diabetes screenings. This checks your fasting blood sugar level. Have the screening done:  Once every three years after age 40 if you are at a normal weight and have a low risk for diabetes.  More often and at a younger age if you are overweight or have a high risk for diabetes. What should I know about preventing infection? Hepatitis B If you have a higher risk for hepatitis B, you should be screened for this virus. Talk with your health care provider to find out if you are at risk for hepatitis B infection. Hepatitis C Testing is recommended for:  Everyone born from 1945 through 1965.  Anyone with known risk factors for hepatitis C. Sexually transmitted infections (STIs)  Get screened for STIs, including gonorrhea and chlamydia, if: ? You are sexually active and are younger than 45 years of age. ? You are older than 45 years of age and your health care provider tells you that you are at risk for this type of infection. ? Your sexual activity has changed since you were last screened, and you are at increased risk for chlamydia or gonorrhea. Ask your health care provider   if you are at risk.  Ask your health care provider about whether you are at high risk for HIV. Your health care provider may recommend a prescription medicine to help prevent HIV infection. If you choose to take medicine to prevent HIV, you should first get tested for HIV. You should then be tested every 3 months for as long as you are taking the medicine. Pregnancy  If you are about to stop having your period (premenopausal) and  you may become pregnant, seek counseling before you get pregnant.  Take 400 to 800 micrograms (mcg) of folic acid every day if you become pregnant.  Ask for birth control (contraception) if you want to prevent pregnancy. Osteoporosis and menopause Osteoporosis is a disease in which the bones lose minerals and strength with aging. This can result in bone fractures. If you are 65 years old or older, or if you are at risk for osteoporosis and fractures, ask your health care provider if you should:  Be screened for bone loss.  Take a calcium or vitamin D supplement to lower your risk of fractures.  Be given hormone replacement therapy (HRT) to treat symptoms of menopause. Follow these instructions at home: Lifestyle  Do not use any products that contain nicotine or tobacco, such as cigarettes, e-cigarettes, and chewing tobacco. If you need help quitting, ask your health care provider.  Do not use street drugs.  Do not share needles.  Ask your health care provider for help if you need support or information about quitting drugs. Alcohol use  Do not drink alcohol if: ? Your health care provider tells you not to drink. ? You are pregnant, may be pregnant, or are planning to become pregnant.  If you drink alcohol: ? Limit how much you use to 0-1 drink a day. ? Limit intake if you are breastfeeding.  Be aware of how much alcohol is in your drink. In the [place] one drink equals one 12 oz bottle of beer (355 mL), one 5 oz glass of wine (148 mL), or one 1 oz glass of hard liquor (44 mL). General instructions  Schedule regular health, dental, and eye exams.  Stay current with your vaccines.  Tell your health care provider if: ? You often feel depressed. ? You have ever been abused or do not feel safe at home. Summary  Adopting a healthy lifestyle and getting preventive care are important in promoting health and wellness.  Follow your health care provider's instructions about healthy  diet, exercising, and getting tested or screened for diseases.  Follow your health care provider's instructions on monitoring your cholesterol and blood pressure. This information is not intended to replace advice given to you by your health care provider. Make sure you discuss any questions you have with your health care provider. Document Revised: 03/20/2018 Document Reviewed: 03/20/2018 Elsevier Patient Education  2021 Elsevier Inc.  

## 2020-06-11 ENCOUNTER — Other Ambulatory Visit: Payer: Self-pay | Admitting: Family Medicine

## 2020-06-11 LAB — DRUG MONITORING, PANEL 8 WITH CONFIRMATION, URINE
6 Acetylmorphine: NEGATIVE ng/mL (ref <10)
Alcohol Metabolites: NEGATIVE ng/mL
Amphetamines: NEGATIVE ng/mL (ref <500)
Benzodiazepines: NEGATIVE ng/mL (ref <100)
Buprenorphine, Urine: NEGATIVE ng/mL (ref <5)
Cocaine Metabolite: NEGATIVE ng/mL (ref <150)
Creatinine: 39.5 mg/dL
MDMA: NEGATIVE ng/mL (ref <500)
Marijuana Metabolite: NEGATIVE ng/mL (ref <20)
Opiates: NEGATIVE ng/mL (ref <100)
Oxidant: NEGATIVE ug/mL
Oxycodone: NEGATIVE ng/mL (ref <100)
pH: 6.9 (ref 4.5–9.0)

## 2020-06-11 LAB — DM TEMPLATE

## 2020-06-11 NOTE — Telephone Encounter (Signed)
OK we can increase synthroid but keep in mind we don't want to make her HYPERthyroid.  With that in mind, go ahead and take 1 and 1/2 of the 100 mcg tabs on three days a week (like m/w/f for example) and continue just 1 tab on all other days of the week.

## 2020-06-18 ENCOUNTER — Other Ambulatory Visit: Payer: Self-pay | Admitting: Family Medicine

## 2020-06-18 MED ORDER — LEVOTHYROXINE SODIUM 125 MCG PO TABS: 125.0000 ug | ORAL_TABLET | Freq: Every day | ORAL | 3 refills | Status: DC

## 2020-06-18 MED ORDER — LEVOTHYROXINE SODIUM 125 MCG PO TABS: 125.0000 ug | ORAL_TABLET | Freq: Every day | ORAL | 0 refills | Status: DC

## 2020-06-18 NOTE — Telephone Encounter (Signed)
Please advise, thanks.

## 2020-07-01 ENCOUNTER — Encounter: Payer: Self-pay | Admitting: Family Medicine

## 2020-07-01 MED ORDER — CITALOPRAM HYDROBROMIDE 20 MG PO TABS: 20.0000 mg | ORAL_TABLET | Freq: Every day | ORAL | 1 refills | Status: DC

## 2020-07-01 NOTE — Telephone Encounter (Signed)
Correction pt does not have a scheduled f/u

## 2020-07-01 NOTE — Telephone Encounter (Signed)
OK. I just sent in citalopram 20mg  qd to her pharmacy, 90d supply. I do recommend f/u in office in about 4-6 wks, though.--thx

## 2020-07-01 NOTE — Telephone Encounter (Signed)
Please advise if okay to send Rx to pharmacy. Pt does not have appt scheduled until May

## 2020-07-30 ENCOUNTER — Encounter: Payer: Self-pay | Admitting: Family Medicine

## 2020-07-30 NOTE — Telephone Encounter (Signed)
F/u appt to discuss, in person preferred

## 2020-07-30 NOTE — Telephone Encounter (Signed)
Please Advise. No available openings for next week. Last seen 06/10/20, advised to f/u 4 weeks

## 2020-08-03 ENCOUNTER — Other Ambulatory Visit: Payer: Self-pay

## 2020-08-06 ENCOUNTER — Other Ambulatory Visit: Payer: Self-pay

## 2020-08-06 ENCOUNTER — Ambulatory Visit (INDEPENDENT_AMBULATORY_CARE_PROVIDER_SITE_OTHER): Payer: BC Managed Care – PPO | Admitting: Family Medicine

## 2020-08-06 ENCOUNTER — Encounter: Payer: Self-pay | Admitting: Family Medicine

## 2020-08-06 VITALS — BP 112/62 | HR 84 | Temp 97.9°F | Resp 18 | Wt 135.4 lb

## 2020-08-06 DIAGNOSIS — E890 Postprocedural hypothyroidism: Secondary | ICD-10-CM

## 2020-08-06 DIAGNOSIS — F4323 Adjustment disorder with mixed anxiety and depressed mood: Secondary | ICD-10-CM | POA: Diagnosis not present

## 2020-08-06 DIAGNOSIS — R21 Rash and other nonspecific skin eruption: Secondary | ICD-10-CM | POA: Diagnosis not present

## 2020-08-06 DIAGNOSIS — F411 Generalized anxiety disorder: Secondary | ICD-10-CM | POA: Diagnosis not present

## 2020-08-06 MED ORDER — CITALOPRAM HYDROBROMIDE 20 MG PO TABS: 20.0000 mg | ORAL_TABLET | Freq: Every day | ORAL | 1 refills | Status: DC

## 2020-08-06 MED ORDER — KETOCONAZOLE 1 % EX SHAM: MEDICATED_SHAMPOO | CUTANEOUS | 2 refills | Status: DC

## 2020-08-06 NOTE — Progress Notes (Signed)
OFFICE VISIT  08/06/2020  CC:  Chief Complaint  Patient presents with  . GAD    Taking Alprazolam 0.25 mg daily. Taking Celexa 20 mg. But pt thinks this is causing Psoriasis-like lesions on the scalp.  . Follow-up    -tlc,rma   HPI:    Patient is a 45 y.o. Caucasian female who presents for 2 mo f/u GAD with superimposed adjustment d/o as well as f/u postablative hypothyroidism. A/P as of last visit: "1) Postablative hypothyroidism: cont synthroid 100 mcg qd. TSH today.  2) GAD, with recent superimposed adjustment d/o with anx/dep mood--->pt ready to try daily antidepressant.  Start citalopram 20mg  qd.  Cont alpraz 0.25mg  bid prn. Renewed CSC today, UDS today.  3) Health maintenance exam: Reviewed age and gender appropriate health maintenance issues (prudent diet, regular exercise, health risks of tobacco and excessive alcohol, use of seatbelts, fire alarms in home, use of sunscreen).  Also reviewed age and gender appropriate health screening as well as vaccine recommendations. Vaccines: Pt declined flu and covid.  Tdap UTD. Labs: fasting HP ordered. Cervical ca screening: UTD, plan for GYN rpt 08/2020. Breast ca screening: pt to schedule her mammogram. Colon ca screening: average risk patient= as per latest guidelines, start screening at 43 yrs of age."  INTERIM HX: Feels signif improvement on citalopram--anxiety level improved/dealing with things better. Mood stable.  About 1 wk after starting it she noted a tight and itchy feeling on back of head, has not been able to see if any rash b/c of where it is and lots of thick hair.  Tried otc dandruff shampoo and no help. Sleep and appetite good.  Her TSH was 4.44 on 06/10/20, which is highest it has ever been and we decided to increase her T4 dose to 125 mcg qd.  Has been on this dose 6 wks now.  Feeling fine.  Past Medical History:  Diagnosis Date  . Anxiety and depression   . Encounter for IUD removal 05/11/2014  . Fibroids,  intramural 10/23/2016  . Hay fever    singulair and zyrtec are helpful  . History of thyroid cancer 2001  . IUD (intrauterine device) in place 11/14/2012   Put in 2011  . Migraine syndrome    very sporadic, triggers unknown  . Nephrolithiasis 2012   No imaging has been done.  Marland Kitchen Postablative hypothyroidism 2002  . TMJ arthralgia    flexeril prn is helpful  . Vaginal Pap smear, abnormal     Past Surgical History:  Procedure Laterality Date  . thyroid ablation  2001  . TONSILLECTOMY AND ADENOIDECTOMY  1980s  . [place]    Outpatient Medications Prior to Visit  Medication Sig Dispense Refill  . ALPRAZolam (XANAX) 0.25 MG tablet TAKE 1 TABLET BY MOUTH TWICE DAILY AS NEEDED FOR ANXIETY 30 tablet 2  . citalopram (CELEXA) 20 MG tablet Take 1 tablet (20 mg total) by mouth daily. 90 tablet 1  . cyclobenzaprine (FLEXERIL) 5 MG tablet TAKE ONE TABLET BY MOUTH THREE TIMES A DAY AS NEEDED FOR TMJ PAIN 30 tablet 3  . diphenhydrAMINE (BENADRYL) 25 MG tablet Take 50 mg by mouth once as needed for itching or allergies (TAKEN ONCE IN RESPONSE TO ALLERGIC REACTION).    Marland Kitchen EPINEPHrine (AUVI-Q) 0.3 mg/0.3 mL IJ SOAJ injection Inject 0.3 mLs (0.3 mg total) into the muscle as needed for anaphylaxis. 2 each 1  . fluconazole (DIFLUCAN) 150 MG tablet Take 1 before period if needed 2 tablet 6  .  levothyroxine (SYNTHROID) 125 MCG tablet Take 1 tablet (125 mcg total) by mouth daily. 90 tablet 3  . montelukast (SINGULAIR) 10 MG tablet TAKE 1 TABLET AT BEDTIME. 90 tablet 3  . SYNTHROID 100 MCG tablet Take by mouth. (Patient not taking: Reported on 08/06/2020)     No facility-administered medications prior to visit.    Allergies  Allergen Reactions  . Bee Venom Itching and Swelling  . Codeine Nausea Only    ROS As per HPI  PE: Vitals with BMI 08/06/2020 06/10/2020 08/13/2019  Height - 5\' 0"  5\' 0"   Weight 135 lbs 6 oz 132 lbs 134 lbs  BMI - 14.38 88.75  Systolic 797 282 060  Diastolic  62 70 64  Pulse 84 82 89    Gen: Alert, well appearing.  Patient is oriented to person, place, time, and situation. AFFECT: pleasant, lucid thought and speech. Scalp: L occipital region with approx 4-5 cm irregular shaped patch of scaly pinkish rash.  No pustules, vesicles, or maceration/ulceration.  LABS:  Lab Results  Component Value Date   TSH 4.44 06/10/2020   Lab Results  Component Value Date   WBC 5.9 06/10/2020   HGB 14.2 06/10/2020   HCT 42.2 06/10/2020   MCV 90.5 06/10/2020   PLT 383.0 06/10/2020   Lab Results  Component Value Date   CREATININE 0.68 06/10/2020   BUN 18 06/10/2020   NA 138 06/10/2020   K 4.0 06/10/2020   CL 104 06/10/2020   CO2 28 06/10/2020   Lab Results  Component Value Date   ALT 11 06/10/2020   AST 16 06/10/2020   ALKPHOS 43 06/10/2020   BILITOT 0.4 06/10/2020   Lab Results  Component Value Date   CHOL 178 06/10/2020   Lab Results  Component Value Date   HDL 59.80 06/10/2020   Lab Results  Component Value Date   LDLCALC 102 (H) 06/10/2020   Lab Results  Component Value Date   TRIG 83.0 06/10/2020   Lab Results  Component Value Date   CHOLHDL 3 06/10/2020    IMPRESSION AND PLAN:  1) GAD with superimposed adjustment d/o-->great response to citalopram 20mg  qd. Continue this. Question of whether this is causing her scalp rash, I don't know but this would be unusual.  2) Scalp rash: seb derm vs psoriasis: trial of ketoconazole shampoo three days a week and call if not improved in 48mo.  3) Postablation hypothyroidism:  Goal TSH around 1.0. Last TSH was 4.44 so we increased her dose to 125 mcg qd. Recheck TSH today.  An After Visit Summary was printed and given to the patient.  FOLLOW UP: 6 wks  Signed:  Crissie Sickles, MD           08/06/2020

## 2020-08-07 LAB — TSH: TSH: 0.77 mIU/L

## 2020-08-09 ENCOUNTER — Other Ambulatory Visit: Payer: Self-pay

## 2020-08-09 ENCOUNTER — Ambulatory Visit (INDEPENDENT_AMBULATORY_CARE_PROVIDER_SITE_OTHER): Payer: BC Managed Care – PPO | Admitting: Adult Health

## 2020-08-09 ENCOUNTER — Other Ambulatory Visit (HOSPITAL_COMMUNITY)
Admission: RE | Admit: 2020-08-09 | Discharge: 2020-08-09 | Disposition: A | Discharge status: Home / Self Care | Payer: BC Managed Care – PPO | Source: Ambulatory Visit | Attending: Adult Health | Admitting: Adult Health

## 2020-08-09 ENCOUNTER — Encounter: Payer: Self-pay | Admitting: Adult Health

## 2020-08-09 VITALS — BP 114/58 | HR 87 | Ht 60.0 in | Wt 135.5 lb

## 2020-08-09 DIAGNOSIS — Z01419 Encounter for gynecological examination (general) (routine) without abnormal findings: Secondary | ICD-10-CM | POA: Insufficient documentation

## 2020-08-09 DIAGNOSIS — Z1211 Encounter for screening for malignant neoplasm of colon: Secondary | ICD-10-CM

## 2020-08-09 DIAGNOSIS — D219 Benign neoplasm of connective and other soft tissue, unspecified: Secondary | ICD-10-CM

## 2020-08-09 DIAGNOSIS — Z1231 Encounter for screening mammogram for malignant neoplasm of breast: Secondary | ICD-10-CM

## 2020-08-09 DIAGNOSIS — N951 Menopausal and female climacteric states: Secondary | ICD-10-CM | POA: Diagnosis not present

## 2020-08-09 LAB — HEMOCCULT GUIAC POC 1CARD (OFFICE): Fecal Occult Blood, POC: NEGATIVE

## 2020-08-09 MED ORDER — FLUCONAZOLE 150 MG PO TABS: ORAL_TABLET | ORAL | 6 refills | Status: DC

## 2020-08-09 NOTE — Progress Notes (Signed)
Patient ID: Melissa Mullen, female   DOB: Nov 20, 1975, 45 y.o.   MRN: [id number] History of Present Illness: Melissa Mullen is a 45 year old white female, married, G1P1 in for a well woman gyn exam and pap. She works at [employer].  PCP is Dr Ernestine Conrad.   Current Medications, Allergies, Past Medical History, Past Surgical History, Family History and Social History were reviewed in [employer] record.     Review of Systems: Patient denies any headaches, hearing loss, fatigue, blurred vision, shortness of breath, chest pain, abdominal pain, problems with bowel movements, urination, or intercourse. No joint pain or mood swings. Periods regular, not heavy  Has some night sweats and sleep disturbance    Physical Exam:.BP (!) 114/58 (BP Location: Left Arm, Patient Position: Sitting, Cuff Size: Normal)   Pulse 87   Ht 5' (1.524 m)   Wt 135 lb 8 oz (61.5 kg)   LMP 07/21/2020   BMI 26.46 kg/m  General:  Well developed, well nourished, no acute distress Skin:  Warm and dry Neck:  Midline trachea, thyroid is surgically absent, good ROM, no lymphadenopathy Lungs; Clear to auscultation bilaterally Breast:  No dominant palpable mass, retraction, or nipple discharge Cardiovascular: Regular rate and rhythm Abdomen:  Soft, non tender, no hepatosplenomegaly Pelvic:  External genitalia is normal in appearance, no lesions.  The vagina is normal in appearance. Urethra has no lesions or masses. The cervix is bulbous.  Uterus is felt to be slightly enlarged, has known fibroids.  No adnexal masses or tenderness noted.Bladder is non tender, no masses felt. Rectal: Good sphincter tone, no polyps, or hemorrhoids felt.  Hemoccult negative. Extremities/musculoskeletal:  No swelling or varicosities noted, no clubbing or cyanosis Psych:  No mood changes, alert and cooperative,seems happy AA is 3 Fall risk is low PHQ 9 score is 0 GAD 7 score is 0  Upstream - 08/09/20 0841       Pregnancy Intention Screening   Does the patient want to become pregnant in the next year? No    Does the patient's partner want to become pregnant in the next year? No    Would the patient like to discuss contraceptive options today? No      Contraception Wrap Up   Current Method No Method - Other Reason    End Method No Method - Other Reason         Examination chaperoned by Levy Pupa LPN  Impression and Plan: 1. Encounter for gynecological examination with Papanicolaou smear of cervix Pap sent Physical in 1 year Pap in 3 if normal Labs with PCP - Cytology - PAP( [place]) Colonoscopy advised at 45   2. Screening mammogram for breast cancer Mammogram scheduled for her 5/9/ at 7 am at [place]; Future  3. Encounter for screening fecal occult blood testing - POCT occult blood stool  4. Peri-menopause Review handout on menopause  Discussed symptoms   5. Fibroids  she decided to not get [facility] and is doing good for now

## 2020-08-09 NOTE — Patient Instructions (Signed)
https://www.womenshealth.gov/menopause/menopause-basics"> https://www.clinicalkey.com">  Menopause Menopause is the normal time of a woman's life when menstrual periods stop completely. It marks the natural end to a woman's ability to become pregnant. It can be defined as the absence of a menstrual period for 12 months without another medical cause. The transition to menopause (perimenopause) most often happens between the ages of 45 and 55, and can last for many years. During perimenopause, hormone levels change in your body, which can cause symptoms and affect your health. Menopause may increase your risk for:  Weakened bones (osteoporosis), which causes fractures.  Depression.  Hardening and narrowing of the arteries (atherosclerosis), which can cause heart attacks and strokes. What are the causes? This condition is usually caused by a natural change in hormone levels that happens as you get older. The condition may also be caused by changes that are not natural, including:  Surgery to remove both ovaries (surgical menopause).  Side effects from some medicines, such as chemotherapy used to treat cancer (chemical menopause). What increases the risk? This condition is more likely to start at an earlier age if you have certain medical conditions or have undergone treatments, including:  A tumor of the pituitary gland in the brain.  A disease that affects the ovaries and hormones.  Certain cancer treatments, such as chemotherapy or hormone therapy, or radiation therapy on the pelvis.  Heavy smoking and excessive alcohol use.  Family history of early menopause. This condition is also more likely to develop earlier in women who are very thin. What are the signs or symptoms? Symptoms of this condition include:  Hot flashes.  Irregular menstrual periods.  Night sweats.  Changes in feelings about sex. This could be a decrease in sex drive or an increased discomfort around your  sexuality.  Vaginal dryness and thinning of the vaginal walls. This may cause painful sex.  Dryness of the skin and development of wrinkles.  Headaches.  Problems sleeping (insomnia).  Mood swings or irritability.  Memory problems.  Weight gain.  Hair growth on the face and chest.  Bladder infections or problems with urinating. How is this diagnosed? This condition is diagnosed based on your medical history, a physical exam, your age, your menstrual history, and your symptoms. Hormone tests may also be done. How is this treated? In some cases, no treatment is needed. You and your health care provider should make a decision together about whether treatment is necessary. Treatment will be based on your individual condition and preferences. Treatment for this condition focuses on managing symptoms. Treatment may include:  Menopausal hormone therapy (MHT).  Medicines to treat specific symptoms or complications.  Acupuncture.  Vitamin or herbal supplements. Before starting treatment, make sure to let your health care provider know if you have a personal or family history of these conditions:  Heart disease.  Breast cancer.  Blood clots.  Diabetes.  Osteoporosis. Follow these instructions at home: Lifestyle  Do not use any products that contain nicotine or tobacco, such as cigarettes, e-cigarettes, and chewing tobacco. If you need help quitting, ask your health care provider.  Get at least 30 minutes of physical activity on 5 or more days each week.  Avoid alcoholic and caffeinated beverages, as well as spicy foods. This may help prevent hot flashes.  Get 7-8 hours of sleep each night.  If you have hot flashes, try: ? Dressing in layers. ? Avoiding things that may trigger hot flashes, such as spicy food, warm places, or stress. ? Taking slow, deep   breaths when a hot flash starts. ? Keeping a fan in your home and office.  Find ways to manage stress, such as deep  breathing, meditation, or journaling.  Consider going to group therapy with other women who are having menopause symptoms. Ask your health care provider about recommended group therapy meetings. Eating and drinking  Eat a healthy, balanced diet that contains whole grains, lean protein, low-fat dairy, and plenty of fruits and vegetables.  Your health care provider may recommend adding more soy to your diet. Foods that contain soy include tofu, tempeh, and soy milk.  Eat plenty of foods that contain calcium and vitamin D for bone health. Items that are rich in calcium include low-fat milk, yogurt, beans, almonds, sardines, broccoli, and kale.   Medicines  Take over-the-counter and prescription medicines only as told by your health care provider.  Talk with your health care provider before starting any herbal supplements. If prescribed, take vitamins and supplements as told by your health care provider. General instructions  Keep track of your menstrual periods, including: ? When they occur. ? How heavy they are and how long they last. ? How much time passes between periods.  Keep track of your symptoms, noting when they start, how often you have them, and how long they last.  Use vaginal lubricants or moisturizers to help with vaginal dryness and improve comfort during sex.  Keep all follow-up visits. This is important. This includes any group therapy or counseling.   Contact a health care provider if:  You are still having menstrual periods after age 55.  You have pain during sex.  You have not had a period for 12 months and you develop vaginal bleeding. Get help right away if you have:  Severe depression.  Excessive vaginal bleeding.  Pain when you urinate.  A fast or irregular heartbeat (palpitations).  Severe headaches.  Abdominal pain or severe indigestion. Summary  Menopause is a normal time of life when menstrual periods stop completely. It is usually defined as  the absence of a menstrual period for 12 months without another medical cause.  The transition to menopause (perimenopause) most often happens between the ages of 45 and 55 and can last for several years.  Symptoms can be managed through medicines, lifestyle changes, and complementary therapies such as acupuncture.  Eat a balanced diet that is rich in nutrients to promote bone health and heart health and to manage symptoms during menopause. This information is not intended to replace advice given to you by your health care provider. Make sure you discuss any questions you have with your health care provider. Document Revised: 12/26/2019 Document Reviewed: 09/11/2019 Elsevier Patient Education  2021 Elsevier Inc.  

## 2020-08-12 LAB — CYTOLOGY - PAP
Adequacy: ABSENT
Comment: NEGATIVE
Diagnosis: NEGATIVE
High risk HPV: NEGATIVE

## 2020-08-16 ENCOUNTER — Ambulatory Visit (HOSPITAL_COMMUNITY)
Admission: RE | Admit: 2020-08-16 | Discharge: 2020-08-16 | Disposition: A | Discharge status: Home / Self Care | Payer: BC Managed Care – PPO | Source: Ambulatory Visit | Attending: Adult Health | Admitting: Adult Health

## 2020-08-16 DIAGNOSIS — Z1231 Encounter for screening mammogram for malignant neoplasm of breast: Secondary | ICD-10-CM | POA: Diagnosis present

## 2020-08-16 LAB — HM MAMMOGRAPHY

## 2020-08-31 ENCOUNTER — Encounter: Payer: Self-pay | Admitting: Family Medicine

## 2020-10-18 ENCOUNTER — Other Ambulatory Visit: Payer: Self-pay | Admitting: Family Medicine

## 2020-10-19 ENCOUNTER — Other Ambulatory Visit: Payer: Self-pay | Admitting: Adult Health

## 2020-10-19 MED ORDER — METRONIDAZOLE 500 MG PO TABS: 500.0000 mg | ORAL_TABLET | Freq: Two times a day (BID) | ORAL | 0 refills | Status: DC

## 2020-10-19 NOTE — Progress Notes (Signed)
Rx flagyl  

## 2021-02-07 ENCOUNTER — Other Ambulatory Visit: Payer: Self-pay | Admitting: Family Medicine

## 2021-02-07 NOTE — Telephone Encounter (Signed)
Requesting: alprazolam Contract: 06/10/20 UDS: 06/10/20 Last Visit:08/06/20 Next Visit: advised to f/u 6 weeks Last Refill:06/10/20(30,2)  Please Advise. Med pending

## 2021-03-29 ENCOUNTER — Other Ambulatory Visit: Payer: Self-pay | Admitting: Family Medicine

## 2021-04-27 ENCOUNTER — Ambulatory Visit: Payer: BC Managed Care – PPO | Admitting: Adult Health

## 2021-04-27 ENCOUNTER — Other Ambulatory Visit: Payer: Self-pay

## 2021-04-27 ENCOUNTER — Encounter: Payer: Self-pay | Admitting: Adult Health

## 2021-04-27 VITALS — BP 122/70 | HR 77 | Ht 60.0 in | Wt 141.0 lb

## 2021-04-27 DIAGNOSIS — N926 Irregular menstruation, unspecified: Secondary | ICD-10-CM

## 2021-04-27 DIAGNOSIS — N951 Menopausal and female climacteric states: Secondary | ICD-10-CM | POA: Diagnosis not present

## 2021-04-27 DIAGNOSIS — R232 Flushing: Secondary | ICD-10-CM | POA: Diagnosis not present

## 2021-04-27 NOTE — Progress Notes (Signed)
°  Subjective:     Patient ID: Melissa Mullen, female   DOB: [birth year], 46 y.o.   MRN: [id number]  HPI Melissa Mullen is a 46 year old white female, married G1P1 in complaining of having missed a period and having hot flashes.  PCP is Dr Ernestine Conrad. Lab Results  Component Value Date   DIAGPAP  08/09/2020    - Negative for intraepithelial lesion or malignancy (NILM)   HPV NOT DETECTED 10/16/2016   [place] Negative 08/09/2020    Review of Systems Missed period +hot flashes +vaginal dryness  Moods OK on celexa    Reviewed past medical,surgical, social and family history. Reviewed medications and allergies.  Objective:   Physical Exam BP 122/70 (BP Location: Left Arm, Patient Position: Sitting, Cuff Size: Normal)    Pulse 77    Ht 5' (1.524 m)    Wt 141 lb (64 kg)    LMP 03/22/2021    BMI 27.54 kg/m     Skin warm and dry.Lungs: clear to ausculation bilaterally. Cardiovascular: regular rate and rhythm.   Upstream - 04/27/21 1622       Pregnancy Intention Screening   Does the patient want to become pregnant in the next year? No    Does the patient's partner want to become pregnant in the next year? No    Would the patient like to discuss contraceptive options today? No      Contraception Wrap Up   Current Method No Contraceptive Precautions    End Method No Contraception Precautions    Contraception Counseling Provided No             Assessment:     1. Peri-menopause Check [place]  Review handout on menopause and peri menopause  - Follicle stimulating hormone  2. Missed period  - Follicle stimulating hormone  3. Hot flashes Will talk when [facility] back, she is aware can have for years - Follicle stimulating hormone     Plan:     Follow up prn

## 2021-04-28 LAB — FOLLICLE STIMULATING HORMONE: FSH: 98.1 m[IU]/mL

## 2021-05-14 ENCOUNTER — Other Ambulatory Visit: Payer: Self-pay | Admitting: Family Medicine

## 2021-05-26 ENCOUNTER — Other Ambulatory Visit: Payer: Self-pay

## 2021-05-26 ENCOUNTER — Ambulatory Visit: Payer: BC Managed Care – PPO | Admitting: Family Medicine

## 2021-05-26 ENCOUNTER — Encounter: Payer: Self-pay | Admitting: Family Medicine

## 2021-05-26 VITALS — BP 110/68 | HR 84 | Temp 98.1°F | Ht 60.0 in | Wt 141.6 lb

## 2021-05-26 DIAGNOSIS — F411 Generalized anxiety disorder: Secondary | ICD-10-CM

## 2021-05-26 DIAGNOSIS — E890 Postprocedural hypothyroidism: Secondary | ICD-10-CM | POA: Diagnosis not present

## 2021-05-26 MED ORDER — LEVOTHYROXINE SODIUM 125 MCG PO TABS: 125.0000 ug | ORAL_TABLET | Freq: Every day | ORAL | 3 refills | Status: DC

## 2021-05-26 NOTE — Progress Notes (Signed)
OFFICE VISIT  05/26/2021  CC:  Chief Complaint  Patient presents with   Follow-up    [facility];     Patient is a 46 y.o. female who presents for f/u hypothyroidism and GAD. A/P as of last visit 07/2020: "1) GAD with superimposed adjustment d/o-->great response to citalopram 20mg  qd. Continue this. Question of whether this is causing her scalp rash, I don't know but this would be unusual.   2) Scalp rash: seb derm vs psoriasis: trial of ketoconazole shampoo three days a week and call if not improved in 64mo.   3) Postablation hypothyroidism:  Goal TSH around 1.0. Last TSH was 4.44 so we increased her dose to 125 mcg qd. Recheck TSH today."  INTERIM HX: Melissa Mullen feels good other than being tired from getting up very early lately.  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 Life still busy with lots of sports with her son.  She missed a period late last year and went to her GYN and turns out her [facility] is quite high and she is now postmenopausal.  She states she feels no difference other than improvement in her chronic pelvic symptoms which were due to fibroids. Other than that, she has gained 7 pounds despite not changing diet or exercise habits.  She says it is hard to judge her energy level but feels like it is pretty stable.  She states she is doing well from an anxiety and mood standpoint on citalopram 20 mg a day.  She rarely takes alprazolam. PMP AWARE reviewed today: most recent rx for alprazolam was filled 02/07/21, # 30, rx by me. No red flags.   Past Medical History:  Diagnosis Date   Anxiety    Anxiety and depression    Encounter for IUD removal 05/11/2014   Fibroids, intramural 10/23/2016   Hay fever    singulair and zyrtec are helpful   History of thyroid cancer 2001   IUD (intrauterine device) in place 11/14/2012   Put in 2011   Migraine syndrome    very sporadic, triggers unknown    Nephrolithiasis 2012   No imaging has been done.   Postablative hypothyroidism 2002   TMJ arthralgia    flexeril prn is helpful   Vaginal Pap smear, abnormal     Past Surgical History:  Procedure Laterality Date   thyroid ablation  2001   TONSILLECTOMY AND ADENOIDECTOMY  1980s   [place]    Outpatient Medications Prior to Visit  Medication Sig Dispense Refill   ALPRAZolam (XANAX) 0.25 MG tablet TAKE ONE TABLET BY MOUTH TWICE A DAY AS NEEDED FOR ANXIETY 30 tablet 3   citalopram (CELEXA) 20 MG tablet Take 1 tablet (20 mg total) by mouth daily. 90 tablet 1   cyclobenzaprine (FLEXERIL) 5 MG tablet TAKE ONE TABLET BY MOUTH THREE TIMES A DAY AS NEEDED FOR TMJ PAIN 30 tablet 3   EPINEPHrine (AUVI-Q) 0.3 mg/0.3 mL IJ SOAJ injection Inject 0.3 mLs (0.3 mg total) into the muscle as needed for anaphylaxis. 2 each 1   fluconazole (DIFLUCAN) 150 MG tablet Take 1 before period if needed 2 tablet 6   montelukast (SINGULAIR) 10 MG tablet TAKE 1 TABLET AT BEDTIME 90 tablet 1   levothyroxine (SYNTHROID) 125 MCG tablet Take 1 tablet (125 mcg total) by mouth daily. 90 tablet 3   diphenhydrAMINE (BENADRYL) 25 MG tablet Take 50 mg by mouth once as  needed for itching or allergies (TAKEN ONCE IN RESPONSE TO ALLERGIC REACTION). (Patient not taking: Reported on 05/26/2021)     Fexofenadine HCl (ALLEGRA PO) Take by mouth. (Patient not taking: Reported on 04/27/2021)     KETOCONAZOLE, TOPICAL, 1 % SHAM Rub into affected area of scalp three days per week (Patient not taking: Reported on 04/27/2021) 125 mL 2   metroNIDAZOLE (FLAGYL) 500 MG tablet Take 1 tablet (500 mg total) by mouth 2 (two) times daily. (Patient not taking: Reported on 04/27/2021) 14 tablet 0   No facility-administered medications prior to visit.    Allergies  Allergen Reactions   Bee Venom Itching and Swelling   Codeine Nausea Only    ROS As per HPI  PE: Vitals with BMI 05/26/2021 04/27/2021 08/09/2020  Height 5\' 0"  5\' 0"   5\' 0"   Weight 141 lbs 10 oz 141 lbs 135 lbs 8 oz  BMI 27.65 02.58 52.77  Systolic 824 235 361  Diastolic 68 70 58  Pulse 84 77 87     Physical Exam  Gen: Alert, well appearing.  Patient is oriented to person, place, time, and situation. AFFECT: pleasant, lucid thought and speech. No further exam today.  LABS:  Last CBC Lab Results  Component Value Date   WBC 5.9 06/10/2020   HGB 14.2 06/10/2020   HCT 42.2 06/10/2020   MCV 90.5 06/10/2020   RDW 14.5 06/10/2020   PLT 383.0 44/31/5400   Last metabolic panel Lab Results  Component Value Date   GLUCOSE 94 06/10/2020   NA 138 06/10/2020   K 4.0 06/10/2020   CL 104 06/10/2020   CO2 28 06/10/2020   BUN 18 06/10/2020   CREATININE 0.68 06/10/2020   CALCIUM 9.6 06/10/2020   PROT 7.2 06/10/2020   ALBUMIN 4.5 06/10/2020   BILITOT 0.4 06/10/2020   ALKPHOS 43 06/10/2020   AST 16 06/10/2020   ALT 11 06/10/2020   Last lipids Lab Results  Component Value Date   CHOL 178 06/10/2020   HDL 59.80 06/10/2020   LDLCALC 102 (H) 06/10/2020   TRIG 83.0 06/10/2020   CHOLHDL 3 06/10/2020   Last hemoglobin A1c No results found for: HGBA1C Last thyroid functions Lab Results  Component Value Date   TSH 0.77 08/06/2020   T3TOTAL 143.9 01/31/2012   IMPRESSION AND PLAN:  #1 post ablative hypothyroidism. TSH goal less than or equal to 1. Continue 125 levothyroxine daily.  Checking TSH today.  #2 anxiety/depression: Feels like she is doing much better on citalopram 20 mg a day.  We will continue this indefinitely. She rarely takes alprazolam and does not need a new prescription today  #3 postmenopausal. Fortunately, no symptoms at this time.  An After Visit Summary was printed and given to the patient.  FOLLOW UP: Return in about 6 months (around 11/23/2021) for routine chronic illness f/u.  Signed:  Crissie Sickles, MD           05/26/2021

## 2021-05-27 LAB — CBC
HCT: 38.8 % (ref 36.0–46.0)
Hemoglobin: 12.7 g/dL (ref 12.0–15.0)
MCHC: 32.7 g/dL (ref 30.0–36.0)
MCV: 91.8 fl (ref 78.0–100.0)
Platelets: 328 10*3/uL (ref 150.0–400.0)
RBC: 4.22 Mil/uL (ref 3.87–5.11)
RDW: 15 % (ref 11.5–15.5)
WBC: 6.6 10*3/uL (ref 4.0–10.5)

## 2021-05-27 LAB — TSH: TSH: 0.09 u[IU]/mL — ABNORMAL LOW (ref 0.35–5.50)

## 2021-05-31 ENCOUNTER — Encounter: Payer: Self-pay | Admitting: Family Medicine

## 2021-08-05 ENCOUNTER — Other Ambulatory Visit: Payer: Self-pay | Admitting: Family Medicine

## 2021-08-16 ENCOUNTER — Encounter: Payer: Self-pay | Admitting: Adult Health

## 2021-08-16 ENCOUNTER — Encounter: Payer: Self-pay | Admitting: Family Medicine

## 2021-08-16 ENCOUNTER — Ambulatory Visit (INDEPENDENT_AMBULATORY_CARE_PROVIDER_SITE_OTHER): Payer: BC Managed Care – PPO | Admitting: Adult Health

## 2021-08-16 VITALS — BP 115/63 | HR 80 | Ht 60.0 in | Wt 145.0 lb

## 2021-08-16 DIAGNOSIS — N951 Menopausal and female climacteric states: Secondary | ICD-10-CM | POA: Diagnosis not present

## 2021-08-16 DIAGNOSIS — K644 Residual hemorrhoidal skin tags: Secondary | ICD-10-CM

## 2021-08-16 DIAGNOSIS — Z1211 Encounter for screening for malignant neoplasm of colon: Secondary | ICD-10-CM | POA: Diagnosis not present

## 2021-08-16 DIAGNOSIS — D219 Benign neoplasm of connective and other soft tissue, unspecified: Secondary | ICD-10-CM

## 2021-08-16 DIAGNOSIS — Z01419 Encounter for gynecological examination (general) (routine) without abnormal findings: Secondary | ICD-10-CM | POA: Diagnosis not present

## 2021-08-16 LAB — HEMOCCULT GUIAC POC 1CARD (OFFICE): Fecal Occult Blood, POC: NEGATIVE

## 2021-08-16 NOTE — Telephone Encounter (Signed)
Please advise 

## 2021-08-16 NOTE — Progress Notes (Signed)
Patient ID: Melissa Mullen, female   DOB: March 10, 1976, 46 y.o.   MRN: [id number] ?History of Present Illness: ?Melissa Mullen is a 46 year old white female, married, G1P1 in for well woman gyn exam. She has hemorrhoid that waxes and wanes for over a year. Aloe with lidocaine helps and she recent bought attachment for toilet that has helped. And she has gained weight esp in middle area.  ?Hot flashes better for now. ?She is wanting to stop celexa, will talk with PCP about weaning off. ? ?Lab Results  ?Component Value Date  ? DIAGPAP  08/09/2020  ?  - Negative for intraepithelial lesion or malignancy (NILM)  ? HPV NOT DETECTED 10/16/2016  ? [place] Negative 08/09/2020  ? PCP is Dr Ernestine Conrad. ? ?Current Medications, Allergies, Past Medical History, Past Surgical History, Family History and Social History were reviewed in [employer] record.   ? ? ?Review of Systems: ?Patient denies any headaches, hearing loss, fatigue, blurred vision, shortness of breath, chest pain, abdominal pain, problems with bowel movements, urination, or intercourse. No joint pain or mood swings.  ?See HPI for positives. ? ? ?Physical Exam:BP 115/63 (BP Location: Right Arm, Patient Position: Sitting, Cuff Size: Normal)   Pulse 80   Ht 5' (1.524 m)   Wt 145 lb (65.8 kg)   LMP 08/04/2021   BMI 28.32 kg/m?  has gained 10 lbs in last year ?General:  Well developed, well nourished, no acute distress ?Skin:  Warm and dry ?Neck:  Midline trachea, surgically absent thyroid, good ROM, no lymphadenopathy ?Lungs; Clear to auscultation bilaterally ?Breast:  No dominant palpable mass, retraction, or nipple discharge ?Cardiovascular: Regular rate and rhythm ?Abdomen:  Soft, non tender, no hepatosplenomegaly ?Pelvic:  External genitalia is normal in appearance, no lesions.  The vagina is normal in appearance. Urethra has no lesions or masses. The cervix is smooth.  Uterus is felt to be slightly enlarged, has known fibroids.  No adnexal  masses or tenderness noted.Bladder is non tender, no masses felt. ?Rectal: Good sphincter tone, no polyps, + hemorrhoids felt. Has remnant that she she says swells and hurts. Hemoccult negative. ?Extremities/musculoskeletal:  No swelling or varicosities noted, no clubbing or cyanosis ?Psych:  No mood changes, alert and cooperative,seems happy ?AA is 2 ?Fall risk is low ? ?  08/16/2021  ?  9:16 AM 05/26/2021  ?  3:35 PM 08/09/2020  ?  8:43 AM  ?Depression screen PHQ 2/9  ?Decreased Interest 0 0 0  ?Down, Depressed, Hopeless 0 0 0  ?PHQ - 2 Score 0 0 0  ?Altered sleeping 0  0  ?Tired, decreased energy 0  0  ?Change in appetite 0  0  ?Feeling bad or failure about yourself  0  0  ?Trouble concentrating 0  0  ?Moving slowly or fidgety/restless 0  0  ?Suicidal thoughts 0  0  ?PHQ-9 Score 0  0  ?  ? ?  08/16/2021  ?  9:17 AM 08/09/2020  ?  8:43 AM  ?GAD 7 : Generalized Anxiety Score  ?Nervous, Anxious, on Edge 0 0  ?Control/stop worrying 0 0  ?Worry too much - different things 0 0  ?Trouble relaxing 0 0  ?Restless 0 0  ?Easily annoyed or irritable 0 0  ?Afraid - awful might happen 0 0  ?Total GAD 7 Score 0 0  ? ? Upstream - 08/16/21 0918   ? ?  ? Pregnancy Intention Screening  ? Does the patient want to become pregnant in  the next year? No   ? Does the patient's partner want to become pregnant in the next year? No   ? Would the patient like to discuss contraceptive options today? No   ?  ? Contraception Wrap Up  ? Current Method No Contraceptive Precautions   ? End Method No Contraception Precautions   ? Contraception Counseling Provided No   ? ?  ?  ? ?  ?  ? Examination chaperoned by Diona Fanti CMA ? ?Impression and Plan: ?1. Encounter for well woman exam with routine gynecological exam ?Physical in 1 year ?Labs with PCP ?Discussed colonoscopy  vs cologuard ? ?2. Encounter for screening fecal occult blood testing ?Hemoccult was negative ? ?3. Peri-menopause ? ?4. Residual hemorrhoidal skin tags ?Continue what you are  doing ?If  wants surgical consult let me know ? ?5. Fibroids ? ? ? ? ?  ?  ?

## 2021-08-16 NOTE — Telephone Encounter (Signed)
Take 1/2 of citalopram tab daily for 15 days, then 1/2 tab every other day for 15 doses, then stop. ? ?

## 2021-09-19 ENCOUNTER — Other Ambulatory Visit: Payer: Self-pay | Admitting: Family Medicine

## 2021-09-19 NOTE — Telephone Encounter (Signed)
Requesting: alprazolam  Contract: 06/10/20 UDS: 06/10/20 Last Visit: 05/26/21 Next Visit: advised to f/u 6 mo Last Refill: 02/07/21(30,3)  Please Advise. Med pending

## 2021-11-01 ENCOUNTER — Other Ambulatory Visit: Payer: Self-pay | Admitting: Adult Health

## 2021-11-15 NOTE — Telephone Encounter (Signed)
Pt has hemorrhoid, seems bigger but not throbbing will call in hemorrhoid cream to [facility] without nitroglycerin

## 2021-12-01 ENCOUNTER — Other Ambulatory Visit: Payer: Self-pay | Admitting: Family Medicine

## 2022-01-16 ENCOUNTER — Encounter: Payer: Self-pay | Admitting: Family Medicine

## 2022-02-08 ENCOUNTER — Ambulatory Visit (INDEPENDENT_AMBULATORY_CARE_PROVIDER_SITE_OTHER): Payer: BC Managed Care – PPO | Admitting: Family Medicine

## 2022-02-08 ENCOUNTER — Encounter: Payer: Self-pay | Admitting: Family Medicine

## 2022-02-08 VITALS — BP 108/73 | HR 78 | Temp 98.1°F | Resp 12 | Ht 60.0 in | Wt 133.2 lb

## 2022-02-08 DIAGNOSIS — E890 Postprocedural hypothyroidism: Secondary | ICD-10-CM

## 2022-02-08 DIAGNOSIS — R42 Dizziness and giddiness: Secondary | ICD-10-CM

## 2022-02-08 DIAGNOSIS — Z0000 Encounter for general adult medical examination without abnormal findings: Secondary | ICD-10-CM

## 2022-02-08 DIAGNOSIS — R634 Abnormal weight loss: Secondary | ICD-10-CM | POA: Diagnosis not present

## 2022-02-08 DIAGNOSIS — R232 Flushing: Secondary | ICD-10-CM

## 2022-02-08 DIAGNOSIS — F411 Generalized anxiety disorder: Secondary | ICD-10-CM

## 2022-02-08 LAB — LIPID PANEL
Cholesterol: 181 mg/dL (ref 0–200)
HDL: 52.5 mg/dL (ref >39.00)
LDL Cholesterol: 116 mg/dL — ABNORMAL HIGH (ref 0–99)
NonHDL: 128.7
Total CHOL/HDL Ratio: 3
Triglycerides: 66 mg/dL (ref 0.0–149.0)
VLDL: 13.2 mg/dL (ref 0.0–40.0)

## 2022-02-08 LAB — COMPREHENSIVE METABOLIC PANEL
ALT: 10 U/L (ref 0–35)
AST: 15 U/L (ref 0–37)
Albumin: 4.7 g/dL (ref 3.5–5.2)
Alkaline Phosphatase: 46 U/L (ref 39–117)
BUN: 17 mg/dL (ref 6–23)
CO2: 29 mEq/L (ref 19–32)
Calcium: 9.9 mg/dL (ref 8.4–10.5)
Chloride: 103 mEq/L (ref 96–112)
Creatinine, Ser: 0.7 mg/dL (ref 0.40–1.20)
GFR: 103.88 mL/min (ref >60.00)
Glucose, Bld: 93 mg/dL (ref 70–99)
Potassium: 4.3 mEq/L (ref 3.5–5.1)
Sodium: 139 mEq/L (ref 135–145)
Total Bilirubin: 0.4 mg/dL (ref 0.2–1.2)
Total Protein: 7.4 g/dL (ref 6.0–8.3)

## 2022-02-08 LAB — POCT GLYCOSYLATED HEMOGLOBIN (HGB A1C): Hemoglobin A1C: 5.5 % (ref 4.0–5.6)

## 2022-02-08 LAB — CBC
HCT: 41.9 % (ref 36.0–46.0)
Hemoglobin: 14.1 g/dL (ref 12.0–15.0)
MCHC: 33.7 g/dL (ref 30.0–36.0)
MCV: 89.8 fl (ref 78.0–100.0)
Platelets: 348 10*3/uL (ref 150.0–400.0)
RBC: 4.66 Mil/uL (ref 3.87–5.11)
RDW: 13.7 % (ref 11.5–15.5)
WBC: 5.9 10*3/uL (ref 4.0–10.5)

## 2022-02-08 LAB — POCT CBG (FASTING - GLUCOSE)-MANUAL ENTRY: Glucose Fasting, POC: 102 mg/dL — AB (ref 70–99)

## 2022-02-08 LAB — TSH: TSH: 0.23 u[IU]/mL — ABNORMAL LOW (ref 0.35–5.50)

## 2022-02-08 NOTE — Progress Notes (Signed)
Office Note 02/08/2022  CC:  Chief Complaint  Patient presents with   Annual Exam   HPI:  Patient is a 46 y.o. female who is here for annual health maintenance exam and follow-up hypothyroidism and anxiety. I last saw her about 9 months ago. A/P as of that visit: "#1 post ablative hypothyroidism. TSH goal less than or equal to 1. Continue 125 levothyroxine daily.  Checking TSH today.   #2 anxiety/depression: Feels like she is doing much better on citalopram 20 mg a day.  We will continue this indefinitely. She rarely takes alprazolam and does not need a new prescription today   #3 postmenopausal. Fortunately, no symptoms at this time."  INTERIM HX: Has had a couple of months of increased tendency to feel flushed and dizzy/lightheaded.  Not necessarily when its been a while between eating.  Feels like she is very hungry when it happens and eating quickly does sometimes help but not always. Additionally, she has had about 12 pounds of nonpurposeful weight loss over the last 6 months and says the majority of this has been in the last month. Says his diet has not changed, nor has her activity or stress level. Overall her energy level is unchanged.  No gastrointestinal upset, no diarrhea. Menses are regular, mild bleeding.  Past Medical History:  Diagnosis Date   Anxiety and depression    Fibroids, intramural 10/23/2016   Hay fever    singulair and zyrtec are helpful   History of thyroid cancer 2001   Migraine syndrome    very sporadic, triggers unknown   Nephrolithiasis 2012   No imaging has been done.   Postablative hypothyroidism 2002   TMJ arthralgia    flexeril prn is helpful   Vaginal Pap smear, abnormal     Past Surgical History:  Procedure Laterality Date   IUD REMOVAL  2016   thyroid ablation  2001   TONSILLECTOMY AND ADENOIDECTOMY  1980s   WISDOM TOOTH EXTRACTION  1996    Family History  Problem Relation Age of Onset   Hyperlipidemia Mother     Diabetes Father    Hypertension Father    Hyperlipidemia Father    Cancer Father        thyroid   Cancer Paternal Aunt        ovarian   Cancer Paternal Uncle    Diabetes Paternal Grandfather    Hypertension Paternal Grandfather    Stroke Paternal Grandfather     Social History   Socioeconomic History   Marital status: Married    Spouse name: Not on file   Number of children: Not on file   Years of education: Not on file   Highest education level: Not on file  Occupational History   Not on file  Tobacco Use   Smoking status: Former    Types: Cigarettes    Quit date: 04/10/2010    Years since quitting: 11.8   Smokeless tobacco: Never  Vaping Use   Vaping Use: Never used  Substance and Sexual Activity   Alcohol use: Yes    Comment: occ   Drug use: No   Sexual activity: Yes    Birth control/protection: None  Other Topics Concern   Not on file  Social History Narrative   Married, mother of one son.   Stay at home mom.     No Tob, minimal alc.   Exercise: sporadically      Social Determinants of Health   Financial Resource Strain: Low Risk  (  02/06/2022)   Overall Financial Resource Strain (CARDIA)    Difficulty of Paying Living Expenses: Not hard at all  Food Insecurity: No Food Insecurity (02/06/2022)   Hunger Vital Sign    Worried About Running Out of Food in the Last Year: Never true    Ran Out of Food in the Last Year: Never true  Transportation Needs: No Transportation Needs (08/16/2021)   PRAPARE - Hydrologist (Medical): No    Lack of Transportation (Non-Medical): No  Physical Activity: Insufficiently Active (02/06/2022)   Exercise Vital Sign    Days of Exercise per Week: 7 days    Minutes of Exercise per Session: 10 min  Stress: No Stress Concern Present (02/06/2022)   [place]    Feeling of Stress : Not at all  Social Connections: Unknown (02/06/2022)    Social Connection and Isolation Panel [NHANES]    Frequency of Communication with Friends and Family: More than three times a week    Frequency of Social Gatherings with Friends and Family: Twice a week    Attends Religious Services: Patient refused    Marine scientist or Organizations: No    Attends Archivist Meetings: Never    Marital Status: Married  Human resources officer Violence: Not At Risk (08/16/2021)   Humiliation, Afraid, Rape, and Kick questionnaire    Fear of Current or Ex-Partner: No    Emotionally Abused: No    Physically Abused: No    Sexually Abused: No    Outpatient Medications Prior to Visit  Medication Sig Dispense Refill   ALPRAZolam (XANAX) 0.25 MG tablet TAKE ONE TABLET BY MOUTH TWICE A DAY AS NEEDED FOR ANXIETY 30 tablet 1   citalopram (CELEXA) 20 MG tablet TAKE ONE TABLET (20MG TOTAL) BY MOUTH DAILY 90 tablet 1   cyclobenzaprine (FLEXERIL) 5 MG tablet TAKE ONE TABLET BY MOUTH THREE TIMES A DAY AS NEEDED FOR TMJ PAIN 30 tablet 3   diphenhydrAMINE (BENADRYL) 25 MG tablet Take 50 mg by mouth once as needed for itching or allergies (TAKEN ONCE IN RESPONSE TO ALLERGIC REACTION).     EPINEPHrine (AUVI-Q) 0.3 mg/0.3 mL IJ SOAJ injection Inject 0.3 mLs (0.3 mg total) into the muscle as needed for anaphylaxis. 2 each 1   fluconazole (DIFLUCAN) 150 MG tablet TAKE ONE TABLET BY MOUTH BEFORE PERIOD IF NEEDED 2 tablet 6   KETOCONAZOLE, TOPICAL, 1 % SHAM Rub into affected area of scalp three days per week (Patient not taking: Reported on 04/27/2021) 125 mL 2   levothyroxine (SYNTHROID) 125 MCG tablet Take 1 tablet (125 mcg total) by mouth daily. 90 tablet 3   montelukast (SINGULAIR) 10 MG tablet TAKE 1 TABLET AT BEDTIME 90 tablet 1   No facility-administered medications prior to visit.    Allergies  Allergen Reactions   Bee Venom Itching and Swelling   Codeine Nausea Only    ROS Review of Systems  Constitutional:  Positive for unexpected weight change.  Negative for appetite change, chills, fatigue and fever.  HENT:  Negative for congestion, dental problem, ear pain and sore throat.   Eyes:  Negative for discharge, redness and visual disturbance.  Respiratory:  Negative for cough, chest tightness, shortness of breath and wheezing.   Cardiovascular:  Negative for chest pain, palpitations and leg swelling.  Gastrointestinal:  Negative for abdominal pain, blood in stool, diarrhea, nausea and vomiting.  Endocrine: Negative for cold intolerance, heat intolerance, polydipsia,  polyphagia and polyuria.  Genitourinary:  Negative for difficulty urinating, dysuria, flank pain, frequency, hematuria and urgency.  Musculoskeletal:  Negative for arthralgias, back pain, joint swelling, myalgias and neck stiffness.  Skin:  Negative for pallor and rash.  Neurological:  Positive for light-headedness. Negative for dizziness, speech difficulty, weakness and headaches.  Hematological:  Negative for adenopathy. Does not bruise/bleed easily.  Psychiatric/Behavioral:  Negative for confusion and sleep disturbance. The patient is not nervous/anxious.     PE;    02/08/2022    8:51 AM 08/16/2021    9:10 AM 05/26/2021    3:37 PM  Vitals with BMI  Height _0  _1  _2   Weight 133 lbs 3 oz 145 lbs 141 lbs 10 oz  BMI 26.01 68.34 19.62  Systolic 229 798 921  Diastolic 73 63 68  Pulse 78 80 84  Orthostatics today: Supine 116/78, 93 Upright 123/74, 93 Standing 115/78, 93  CMA chaperoned exam  Gen: Alert, well appearing.  Patient is oriented to person, place, time, and situation. AFFECT: pleasant, lucid thought and speech. ENT: Ears: EACs clear, normal epithelium.  TMs with good light reflex and landmarks bilaterally.  Eyes: no injection, icteris, swelling, or exudate.  EOMI, PERRLA. Nose: no drainage or turbinate edema/swelling.  No injection or focal lesion.  Mouth: lips without lesion/swelling.  Oral mucosa pink and moist.  Dentition intact and without obvious  caries or gingival swelling.  Oropharynx without erythema, exudate, or swelling.  Neck: supple/nontender.  No LAD, mass, or TM.  Carotid pulses 2+ bilaterally, without bruits. CV: RRR, no m/r/g.   LUNGS: CTA bilat, nonlabored resps, good aeration in all lung fields. ABD: soft, NT, ND, BS normal.  No hepatospenomegaly or mass.  No bruits. EXT: no clubbing, cyanosis, or edema.  Musculoskeletal: no joint swelling, erythema, warmth, or tenderness.  ROM of all joints intact. Skin - no sores or suspicious lesions or rashes or color changes  Pertinent labs:  Lab Results  Component Value Date   TSH 0.09 (L) 05/26/2021   Lab Results  Component Value Date   WBC 6.6 05/26/2021   HGB 12.7 05/26/2021   HCT 38.8 05/26/2021   MCV 91.8 05/26/2021   PLT 328.0 05/26/2021   Lab Results  Component Value Date   CREATININE 0.68 06/10/2020   BUN 18 06/10/2020   NA 138 06/10/2020   K 4.0 06/10/2020   CL 104 06/10/2020   CO2 28 06/10/2020   Lab Results  Component Value Date   ALT 11 06/10/2020   AST 16 06/10/2020   ALKPHOS 43 06/10/2020   BILITOT 0.4 06/10/2020   Lab Results  Component Value Date   CHOL 178 06/10/2020   Lab Results  Component Value Date   HDL 59.80 06/10/2020   Lab Results  Component Value Date   LDLCALC 102 (H) 06/10/2020   Lab Results  Component Value Date   TRIG 83.0 06/10/2020   Lab Results  Component Value Date   CHOLHDL 3 06/10/2020   ASSESSMENT AND PLAN:   #1 health maintenance exam: Reviewed age and gender appropriate health maintenance issues (prudent diet, regular exercise, health risks of tobacco and excessive alcohol, use of seatbelts, fire alarms in home, use of sunscreen).  Also reviewed age and gender appropriate health screening as well as vaccine recommendations. Vaccines: Flu->declined.  O/W UTD. Labs: fasting HP +Hba1c today Cervical ca screening: via GYN MD Breast ca screening: via GYN MD Colon ca screening: She is due for initial  screening.  Options  reviewed-->she defers for now.  #2 episodic lightheadedness with hot flashes. Low suspicion of hypoglycemia causing this but encouraged her to get a glucometer for home use to check glucose particularly when she has the symptoms. Fasting glucose here today was 102.  Hemoglobin A1c 5.5%. Other things to consider are perimenopausal symptoms.  Additionally, excessive thyroxine supplementation could be occurring.  #3 post ablative hypothyroidism. Our goal for her TSH has been 0.1, but considering her symptoms and #2 above we may have to cut back on levothyroxine some and shoot for a goal TSH of 1-2.  #4 abnormal weight loss. Checking TSH, c-Met, CBC.  An After Visit Summary was printed and given to the patient.  FOLLOW UP:  Return in about 6 months (around 08/09/2022) for routine chronic illness f/u.  Signed:  Crissie Sickles, MD           02/08/2022

## 2022-02-08 NOTE — Patient Instructions (Signed)

## 2022-02-24 ENCOUNTER — Ambulatory Visit: Payer: BC Managed Care – PPO | Admitting: Family Medicine

## 2022-03-22 IMAGING — MG MM DIGITAL SCREENING BILAT W/ TOMO AND CAD
6 of 10 series · 6 of 30 positions shown · non-contrast
Comparison: None.

CLINICAL DATA: Screening.

EXAM:
DIGITAL SCREENING BILATERAL MAMMOGRAM WITH TOMOSYNTHESIS AND CAD
TECHNIQUE: Bilateral screening digital craniocaudal and mediolateral oblique
mammograms were obtained. Bilateral screening digital breast
tomosynthesis was performed. The images were evaluated with
computer-aided detection.

[R MLO synth-2D (1 of 2)]
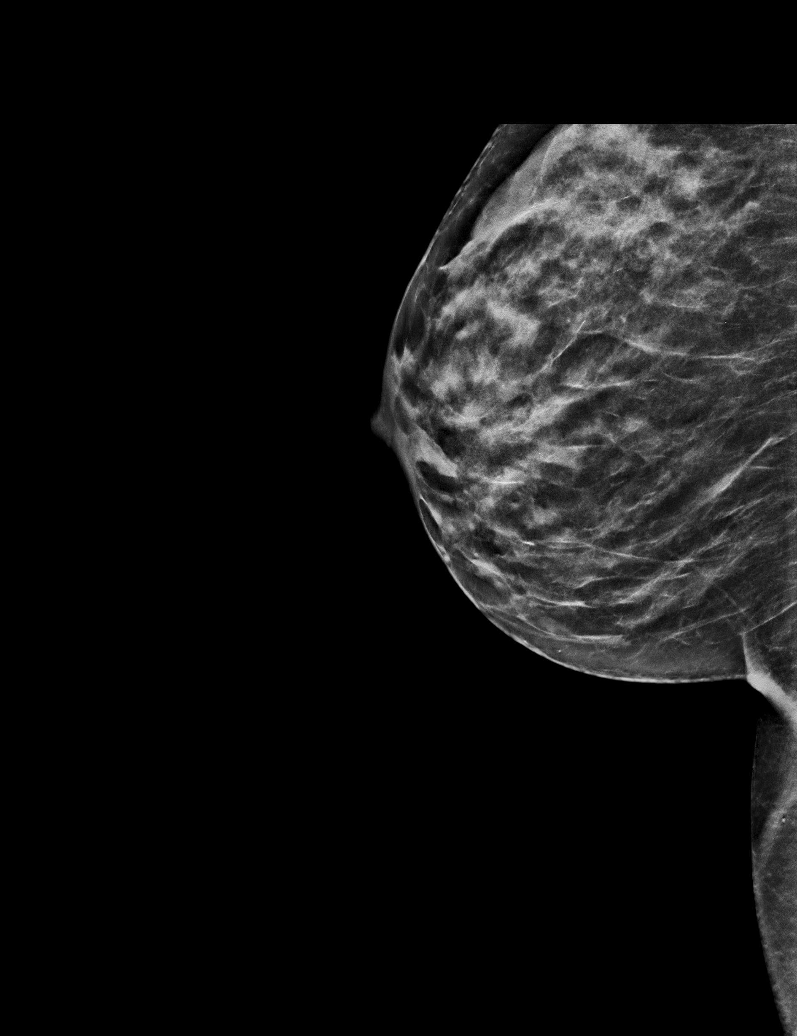

[L CC synth-2D]
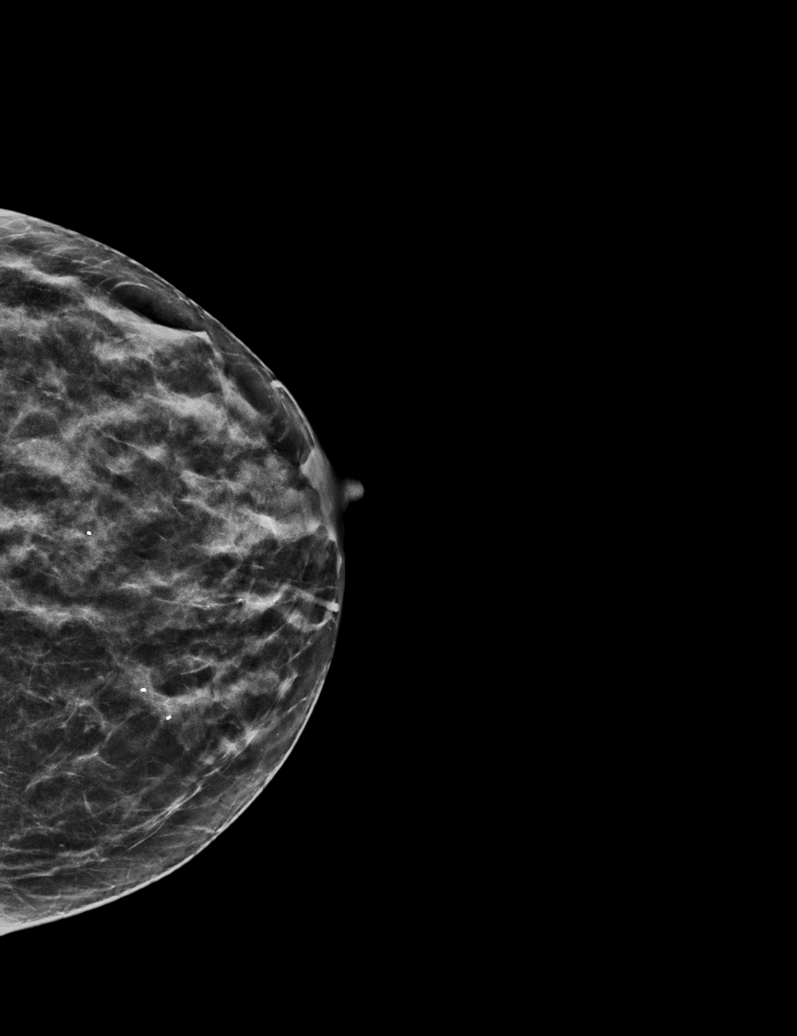

[R CC synth-2D]
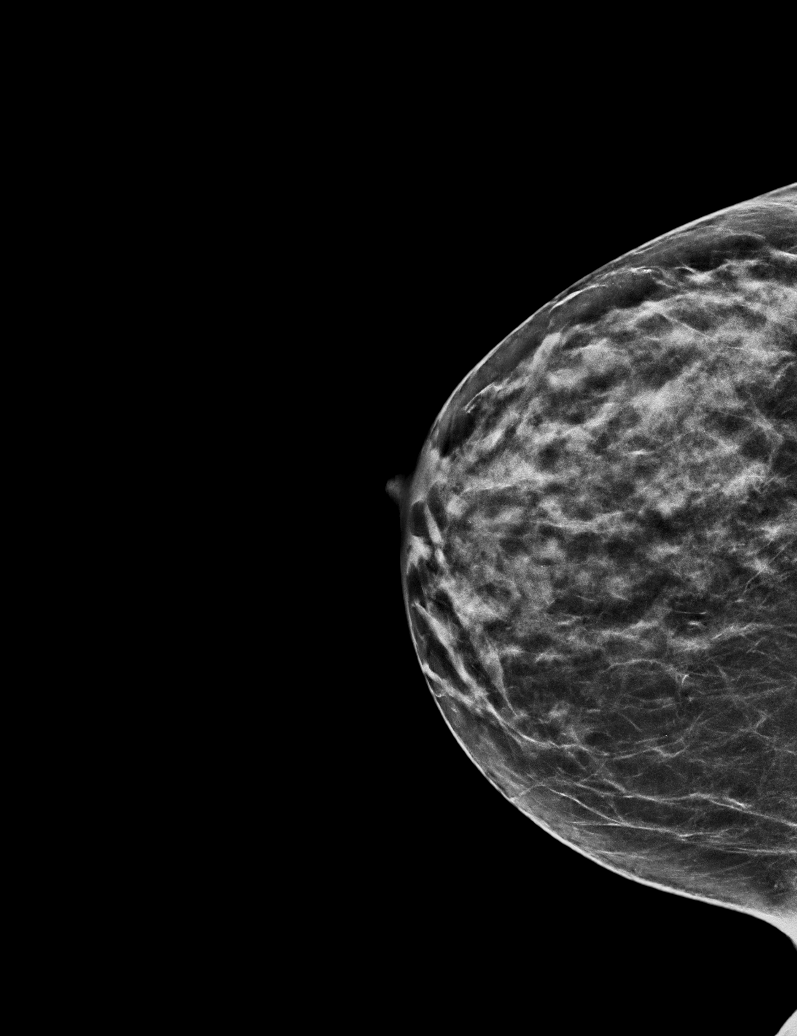

[L MLO synth-2D]
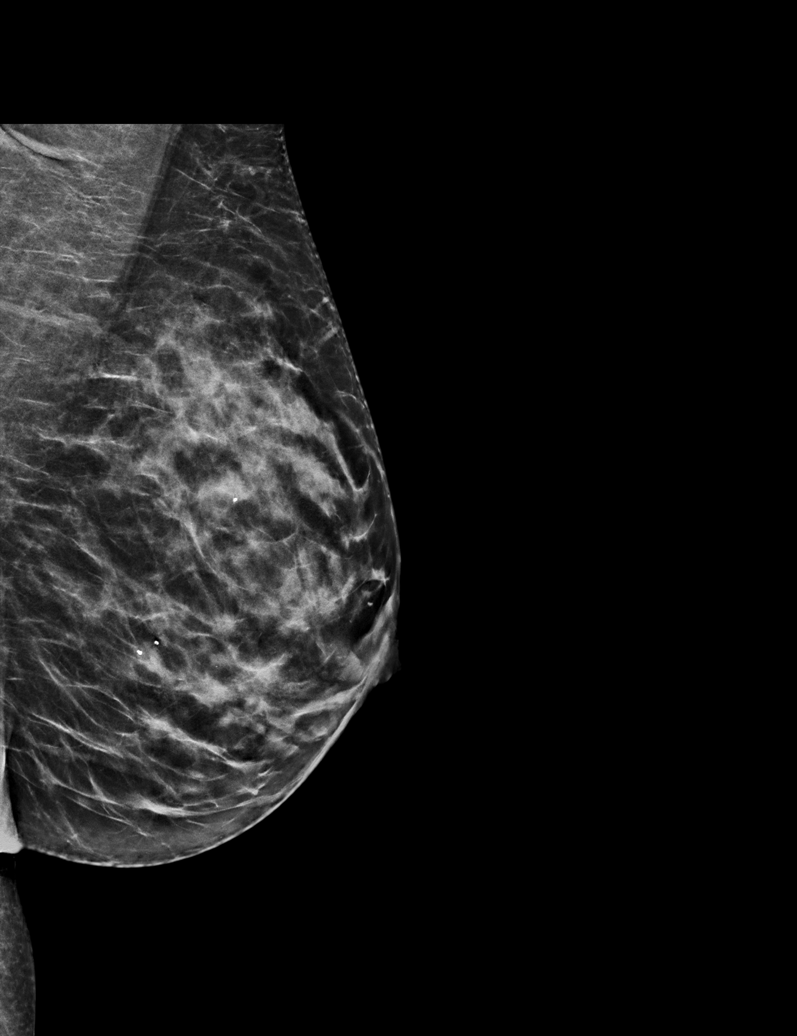

[R MLO synth-2D (2 of 2)]
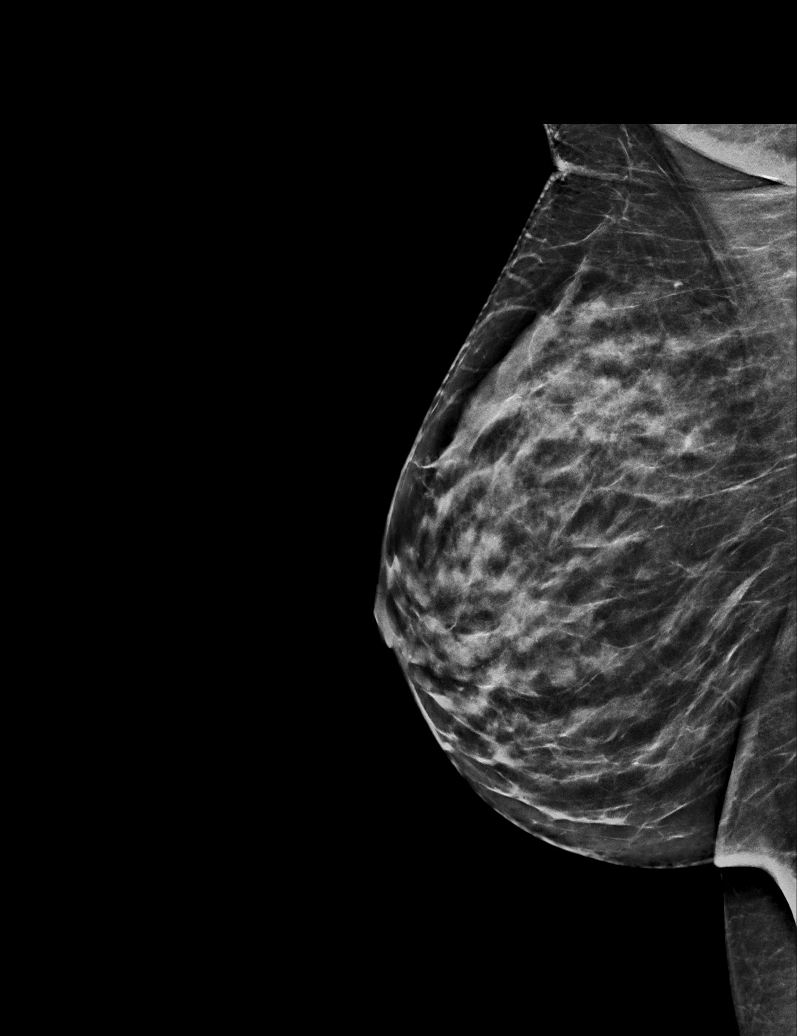

[L MLO tomo · tomo slice 31/60.0]
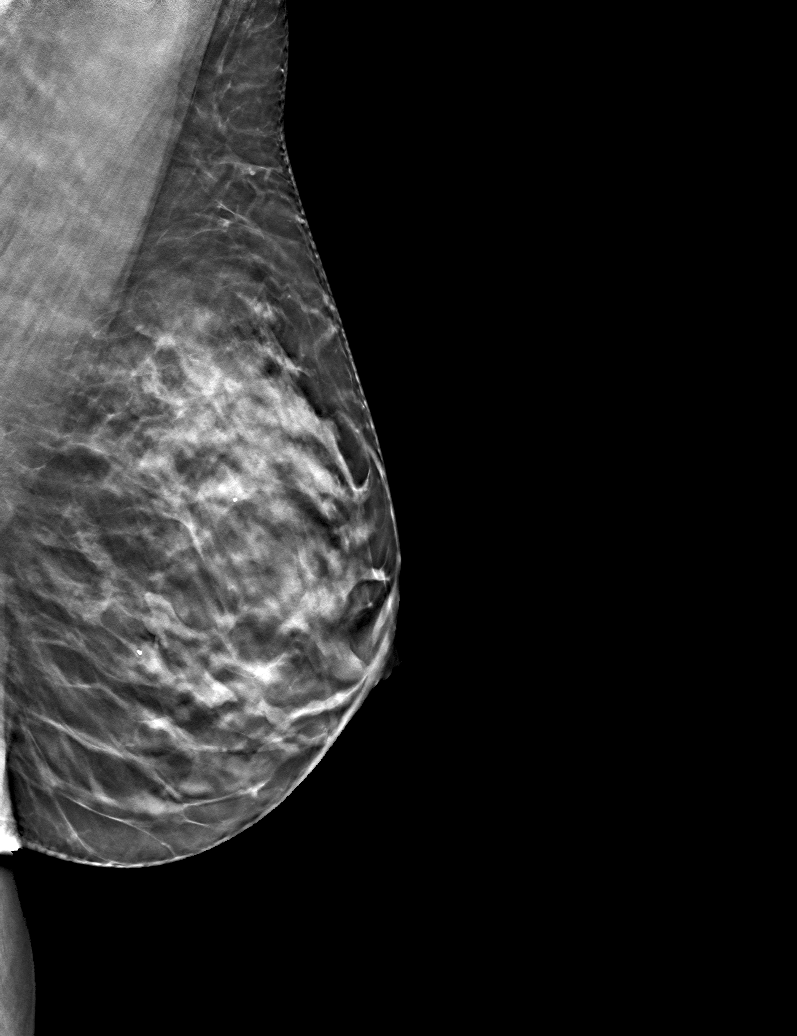

[6 of 30 positions shown; findings below may reference images not displayed]

ACR Breast Density Category c: The breast tissue is heterogeneously
dense, which may obscure small masses
FINDINGS: There are no findings suspicious for malignancy. The images were
evaluated with computer-aided detection.
IMPRESSION: No mammographic evidence of malignancy. A result letter of this
screening mammogram will be mailed directly to the patient.

RECOMMENDATION:
Screening mammogram in one year. (Code:CQ-1-UDH)

BI-RADS CATEGORY  1: Negative.

## 2022-04-07 ENCOUNTER — Other Ambulatory Visit: Payer: Self-pay | Admitting: Family Medicine

## 2022-04-07 NOTE — Telephone Encounter (Signed)
RF request for cyclobenzaprine LOV: 02/08/22 Next ov: advised to f/u 6 mo Last written: 06/10/20 (30,3)  Requesting: alprazolam Contract: 06/10/20 UDS: 06/10/20 Last Visit: 02/08/22 Next Visit: advised to f/u 6 mo  Last Refill: 09/19/21 (30,1)  Please Advise. Medications pending

## 2022-05-12 ENCOUNTER — Other Ambulatory Visit: Payer: Self-pay | Admitting: Family Medicine

## 2022-05-30 ENCOUNTER — Other Ambulatory Visit: Payer: Self-pay | Admitting: Family Medicine

## 2022-07-21 ENCOUNTER — Ambulatory Visit: Payer: BC Managed Care – PPO | Admitting: Family Medicine

## 2022-07-21 NOTE — Progress Notes (Deleted)
Melissa Mullen , [birth year], 47 y.o., female MRN: [id number] Patient Care Team    Relationship Specialty Notifications Start End  Jeoffrey Massed, MD PCP - General Family Medicine  01/31/12   Adline Potter, NP Nurse Practitioner Obstetrics and Gynecology  10/02/18     No chief complaint on file.    Subjective: Melissa Mullen is a 47 y.o. Pt presents for an OV with complaints of *** of *** duration.  Associated symptoms include ***.  Pt has tried *** to ease their symptoms.      02/08/2022    8:47 AM 08/16/2021    9:16 AM 05/26/2021    3:35 PM 08/09/2020    8:43 AM 06/10/2020    8:07 AM  Depression screen PHQ 2/9  Decreased Interest 0 0 0 0 0  Down, Depressed, Hopeless 0 0 0 0 0  PHQ - 2 Score 0 0 0 0 0  Altered sleeping  0  0   Tired, decreased energy  0  0   Change in appetite  0  0   Feeling bad or failure about yourself   0  0   Trouble concentrating  0  0   Moving slowly or fidgety/restless  0  0   Suicidal thoughts  0  0   PHQ-9 Score  0  0     Allergies  Allergen Reactions   Bee Venom Itching and Swelling   Codeine Nausea Only   Social History   Social History Narrative   Married, mother of one son.   Stay at home mom.     No Tob, minimal alc.   Exercise: sporadically      Past Medical History:  Diagnosis Date   Anxiety and depression    Fibroids, intramural 10/23/2016   Hay fever    singulair and zyrtec are helpful   History of thyroid cancer 2001   Migraine syndrome    very sporadic, triggers unknown   Nephrolithiasis 2012   No imaging has been done.   Postablative hypothyroidism 2002   TMJ arthralgia    flexeril prn is helpful   Vaginal Pap smear, abnormal    Past Surgical History:  Procedure Laterality Date   IUD REMOVAL  2016   thyroid ablation  2001   TONSILLECTOMY AND ADENOIDECTOMY  1980s   WISDOM TOOTH EXTRACTION  1996   Family History  Problem Relation Age of Onset   Hyperlipidemia Mother    Diabetes Father     Hypertension Father    Hyperlipidemia Father    Cancer Father        thyroid   Cancer Paternal Aunt        ovarian   Cancer Paternal Uncle    Diabetes Paternal Grandfather    Hypertension Paternal Grandfather    Stroke Paternal Grandfather    Allergies as of 07/21/2022       Reactions   Bee Venom Itching, Swelling   Codeine Nausea Only        Medication List        Accurate as of July 21, 2022  7:36 AM. If you have any questions, ask your nurse or doctor.          ALPRAZolam 0.25 MG tablet Commonly known as: XANAX TAKE ONE TABLET BY MOUTH TWICE A DAY AS NEEDED FOR ANXIETY   citalopram 20 MG tablet Commonly known as: CELEXA TAKE ONE TABLET (  TOTAL) BY MOUTH DAILY   cyclobenzaprine  5 MG tablet Commonly known as: FLEXERIL TAKE ONE (1) TABLET BY MOUTH 3 TIMES DAILY AS NEEDED FOR TMJ PAIN   diphenhydrAMINE 25 MG tablet Commonly known as: BENADRYL Take 50 mg by mouth once as needed for itching or allergies (TAKEN ONCE IN RESPONSE TO ALLERGIC REACTION).   EPINEPHrine 0.3 mg/0.3 mL Soaj injection Commonly known as: Auvi-Q Inject 0.3 mLs (0.3 mg total) into the muscle as needed for anaphylaxis.   fluconazole 150 MG tablet Commonly known as: DIFLUCAN TAKE ONE TABLET BY MOUTH BEFORE PERIOD IF NEEDED   KETOCONAZOLE (TOPICAL) 1 % Sham Rub into affected area of scalp three days per week   montelukast 10 MG tablet Commonly known as: SINGULAIR TAKE 1 TABLET AT BEDTIME   Synthroid 125 MCG tablet Generic drug: levothyroxine TAKE 1 TABLET DAILY        All past medical history, surgical history, allergies, family history, immunizations andmedications were updated in the EMR today and reviewed under the history and medication portions of their EMR.     ROS Negative, with the exception of above mentioned in HPI   Objective:  There were no vitals taken for this visit. There is no height or weight on file to calculate BMI.  Physical Exam   No results  found. No results found. No results found for this or any previous visit (from the past 24 hour(s)).  Assessment/Plan: Melissa Mullen is a 47 y.o. female present for OV for  *** Reviewed expectations re: course of current medical issues. Discussed self-management of symptoms. Outlined signs and symptoms indicating need for more acute intervention. Patient verbalized understanding and all questions were answered. Patient received an After-Visit Summary.    No orders of the defined types were placed in this encounter.  No orders of the defined types were placed in this encounter.  Referral Orders  No referral(s) requested today     Note is dictated utilizing voice recognition software. Although note has been proof read prior to signing, occasional typographical errors still can be missed. If any questions arise, please do not hesitate to call for verification.   electronically signed by:  Felix Pacini, DO  [facility] - [place]

## 2022-07-27 ENCOUNTER — Other Ambulatory Visit: Payer: Self-pay | Admitting: Family Medicine

## 2022-09-08 ENCOUNTER — Encounter: Payer: Self-pay | Admitting: Adult Health

## 2022-09-08 ENCOUNTER — Ambulatory Visit (INDEPENDENT_AMBULATORY_CARE_PROVIDER_SITE_OTHER): Payer: BC Managed Care – PPO | Admitting: Adult Health

## 2022-09-08 VITALS — BP 133/60 | HR 76 | Ht 59.0 in | Wt 132.0 lb

## 2022-09-08 DIAGNOSIS — Z01419 Encounter for gynecological examination (general) (routine) without abnormal findings: Secondary | ICD-10-CM | POA: Diagnosis not present

## 2022-09-08 DIAGNOSIS — N951 Menopausal and female climacteric states: Secondary | ICD-10-CM

## 2022-09-08 DIAGNOSIS — Z1211 Encounter for screening for malignant neoplasm of colon: Secondary | ICD-10-CM

## 2022-09-08 DIAGNOSIS — Z1231 Encounter for screening mammogram for malignant neoplasm of breast: Secondary | ICD-10-CM | POA: Diagnosis not present

## 2022-09-08 LAB — HEMOCCULT GUIAC POC 1CARD (OFFICE): Fecal Occult Blood, POC: NEGATIVE

## 2022-09-08 NOTE — Progress Notes (Signed)
Patient ID: Melissa Mullen, female   DOB: 1976-01-21, 47 y.o.   MRN: [id number] History of Present Illness: Melissa Mullen is a 47 year old white female, married, G1P1 in for a well woman gyn exam. Her last period she had right kidney pain, like when she had kidney stone. Moody at times, sleeps well, has some hot flashes. Periods monthly, but cycles vary some.  Last pap was negative HPV,NILM 08/09/20.  PCP is Dr Milinda Cave  Current Medications, Allergies, Past Medical History, Past Surgical History, Family History and Social History were reviewed in [employer] electronic medical record.     Review of Systems: Patient denies any headaches, hearing loss, fatigue, blurred vision, shortness of breath, chest pain, abdominal pain, problems with bowel movements, urination, or intercourse. No joint pain or mood swings.  See HPI for positives   Physical Exam:BP 133/60 (BP Location: Left Arm, Patient Position: Sitting, Cuff Size: Normal)   Pulse 76   Ht 4\' 11"  (1.499 m)   Wt 132 lb (59.9 kg)   LMP 08/15/2022   BMI 26.66 kg/m   General:  Well developed, well nourished, no acute distress Skin:  Warm and dry Neck:  Midline trachea, surgically absent  thyroid, good ROM, no lymphadenopathy Lungs; Clear to auscultation bilaterally Breast:  No dominant palpable mass, retraction, or nipple discharge Cardiovascular: Regular rate and rhythm Abdomen:  Soft, non tender, no hepatosplenomegaly Pelvic:  External genitalia is normal in appearance, no lesions.  The vagina is normal in appearance. Urethra has no lesions or masses. The cervix is smooth.  Uterus is felt to be normal size, shape, and contour.  No adnexal masses or tenderness noted.Bladder is non tender, no masses felt. Rectal: Good sphincter tone, no polyps, or hemorrhoids felt.  Hemoccult negative. Extremities/musculoskeletal:  No swelling or varicosities noted, no clubbing or cyanosis Psych:  No mood changes, alert and cooperative,seems happy AA is   1 Fall risk is low    09/08/2022   10:25 AM 02/08/2022    8:47 AM 08/16/2021    9:16 AM  Depression screen PHQ 2/9  Decreased Interest 0 0 0  Down, Depressed, Hopeless 0 0 0  PHQ - 2 Score 0 0 0  Altered sleeping 0  0  Tired, decreased energy 0  0  Change in appetite 0  0  Feeling bad or failure about yourself  0  0  Trouble concentrating 0  0  Moving slowly or fidgety/restless 0  0  Suicidal thoughts 0  0  PHQ-9 Score 0  0       09/08/2022   10:25 AM 08/16/2021    9:17 AM 08/09/2020    8:43 AM  GAD 7 : Generalized Anxiety Score  Nervous, Anxious, on Edge 0 0 0  Control/stop worrying 0 0 0  Worry too much - different things 0 0 0  Trouble relaxing 0 0 0  Restless 0 0 0  Easily annoyed or irritable 0 0 0  Afraid - awful might happen 0 0 0  Total GAD 7 Score 0 0 0      Upstream - 09/08/22 1029       Pregnancy Intention Screening   Does the patient want to become pregnant in the next year? No    Does the patient's partner want to become pregnant in the next year? No    Would the patient like to discuss contraceptive options today? No      Contraception Wrap Up   Current Method No Method -  Other Reason    Reason for No Current Contraceptive Method at Intake ([facility] Only) Other    End Method No Method - Other Reason             Examination chaperoned by Malachy Mood LPN   Impression and Plan: 1. Encounter for well woman exam with routine gynecological exam Pap and physical in 1 year Labs with PCP Advised Cologuard or colonoscopy talk with PCP  2. Encounter for screening fecal occult blood testing Hemoccult was negative  - POCT occult blood stool  3. Peri-menopause  4. Screening mammogram for breast cancer Mammogram scheduled for her for 09/18/22 at 8 am at [facility]  - MM 3D SCREENING MAMMOGRAM BILATERAL BREAST; Future

## 2022-09-18 ENCOUNTER — Ambulatory Visit (HOSPITAL_COMMUNITY): Payer: BC Managed Care – PPO

## 2022-10-17 ENCOUNTER — Other Ambulatory Visit: Payer: Self-pay | Admitting: Family Medicine

## 2022-10-18 ENCOUNTER — Other Ambulatory Visit: Payer: Self-pay

## 2022-10-18 MED ORDER — LEVOTHYROXINE SODIUM 125 MCG PO TABS: 125.0000 ug | ORAL_TABLET | Freq: Every day | ORAL | 0 refills | Status: DC

## 2022-10-18 NOTE — Telephone Encounter (Addendum)
RF request for SYNTHROID 125 MCG tablet  LOV: 02/08/22 cpe Next ov: advised to f/u May Last written: 07/28/22 (90,0)  Pt is currently overdue for 6 month follow up. 14 day supply sent to local pharmacy.

## 2022-11-02 ENCOUNTER — Ambulatory Visit: Payer: BC Managed Care – PPO | Admitting: Family Medicine

## 2022-11-08 NOTE — Patient Instructions (Signed)
It was very nice to see you today!   PLEASE NOTE:   If you had any lab tests please let us know if you have not heard back within a few days. You may see your results on MyChart before we have a chance to review them but we will give you a call once they are reviewed by us. If we ordered any referrals today, please let us know if you have not heard from their office within the next 2 weeks. You should receive a letter via MyChart confirming if the referral was approved and their office contact information to schedule.  

## 2022-11-09 ENCOUNTER — Ambulatory Visit: Payer: BC Managed Care – PPO | Admitting: Family Medicine

## 2022-11-09 ENCOUNTER — Encounter: Payer: Self-pay | Admitting: Family Medicine

## 2022-11-09 VITALS — BP 109/69 | HR 79 | Wt 130.6 lb

## 2022-11-09 DIAGNOSIS — F419 Anxiety disorder, unspecified: Secondary | ICD-10-CM | POA: Diagnosis not present

## 2022-11-09 DIAGNOSIS — E890 Postprocedural hypothyroidism: Secondary | ICD-10-CM

## 2022-11-09 MED ORDER — ALPRAZOLAM 0.25 MG PO TABS: ORAL_TABLET | ORAL | 1 refills | Status: DC

## 2022-11-09 MED ORDER — LEVOTHYROXINE SODIUM 125 MCG PO TABS: 125.0000 ug | ORAL_TABLET | Freq: Every day | ORAL | 3 refills | Status: DC

## 2022-11-09 MED ORDER — CYCLOBENZAPRINE HCL 5 MG PO TABS: ORAL_TABLET | ORAL | 3 refills | Status: DC

## 2022-11-09 NOTE — Progress Notes (Signed)
OFFICE VISIT  11/09/2022  CC:  Chief Complaint  Patient presents with   Follow-up    Follow up on TSH. No other questions or concerns.    Patient is a 47 y.o. female who presents for 76-month follow-up hypothyroidism and anxiety/depression. A/P as of last visit: "#1 health maintenance exam: Reviewed age and gender appropriate health maintenance issues (prudent diet, regular exercise, health risks of tobacco and excessive alcohol, use of seatbelts, fire alarms in home, use of sunscreen).  Also reviewed age and gender appropriate health screening as well as vaccine recommendations. Vaccines: Flu->declined.  O/W UTD. Labs: fasting HP +Hba1c today Cervical ca screening: via GYN MD Breast ca screening: via GYN MD Colon ca screening: She is due for initial screening.  Options reviewed-->she defers for now.   #2 episodic lightheadedness with hot flashes. Low suspicion of hypoglycemia causing this but encouraged her to get a glucometer for home use to check glucose particularly when she has the symptoms. Fasting glucose here today was 102.  Hemoglobin A1c 5.5%. Other things to consider are perimenopausal symptoms.  Additionally, excessive thyroxine supplementation could be occurring.   #3 post ablative hypothyroidism. Our goal for her TSH has been 0.1, but considering her symptoms and #2 above we may have to cut back on levothyroxine some and shoot for a goal TSH of 1-2.   #4 abnormal weight loss. Checking TSH, c-Met, CBC."  INTERIM HX: Feeling well. Only occasionally takes alprazolam.  Has a little bit of perimenopausal symptoms, including brain fog, mild hot flashes, and irregular periods.  Last visit her TSH was a little bit low at 0.23.  I recommended she skip her levothyroxine 1 day a week at that time.  PMP AWARE reviewed today: most recent rx for alprazolam 0.25 mg was filled 04/11/2022, # 30, rx by me. No red flags.  Past Medical History:  Diagnosis Date   Anxiety and  depression    Fibroids, intramural 10/23/2016   Hay fever    singulair and zyrtec are helpful   History of thyroid cancer 2001   Migraine syndrome    very sporadic, triggers unknown   Nephrolithiasis 2012   No imaging has been done.   Postablative hypothyroidism 2002   TMJ arthralgia    flexeril prn is helpful   Vaginal Pap smear, abnormal     Past Surgical History:  Procedure Laterality Date   IUD REMOVAL  2016   thyroid ablation  2001   TONSILLECTOMY AND ADENOIDECTOMY  1980s   WISDOM TOOTH EXTRACTION  1996    Outpatient Medications Prior to Visit  Medication Sig Dispense Refill   ALPRAZolam (XANAX) 0.25 MG tablet TAKE ONE TABLET BY MOUTH TWICE A DAY AS NEEDED FOR ANXIETY 30 tablet 1   cyclobenzaprine (FLEXERIL) 5 MG tablet TAKE ONE (1) TABLET BY MOUTH 3 TIMES DAILY AS NEEDED FOR TMJ PAIN 30 tablet 3   diphenhydrAMINE (BENADRYL) 25 MG tablet Take 50 mg by mouth once as needed for itching or allergies (TAKEN ONCE IN RESPONSE TO ALLERGIC REACTION).     EPINEPHrine (AUVI-Q) 0.3 mg/0.3 mL IJ SOAJ injection Inject 0.3 mLs (0.3 mg total) into the muscle as needed for anaphylaxis. 2 each 1   levothyroxine (SYNTHROID) 125 MCG tablet Take 1 tablet (125 mcg total) by mouth daily. OFFICE VISIT NEEDED FOR FURTHER REFILLS 14 tablet 0   montelukast (SINGULAIR) 10 MG tablet TAKE 1 TABLET AT BEDTIME 90 tablet 1   No facility-administered medications prior to visit.    Allergies  Allergen Reactions   Bee Venom Itching and Swelling   Codeine Nausea Only    Review of Systems As per HPI  PE:    11/09/2022    3:32 PM 09/08/2022   10:26 AM 02/08/2022    8:51 AM  Vitals with BMI  Height  4\' 11"  5\' 0"   Weight 130 lbs 10 oz 132 lbs 133 lbs 3 oz  BMI  26.65 26.01  Systolic 109 133 283  Diastolic 69 60 73  Pulse 79 76 78     Physical Exam  Gen: Alert, well appearing.  Patient is oriented to person, place, time, and situation. AFFECT: pleasant, lucid thought and speech. No further  exam today  LABS:  Last CBC Lab Results  Component Value Date   WBC 5.9 02/08/2022   HGB 14.1 02/08/2022   HCT 41.9 02/08/2022   MCV 89.8 02/08/2022   RDW 13.7 02/08/2022   PLT 348.0 02/08/2022   Last metabolic panel Lab Results  Component Value Date   GLUCOSE 93 02/08/2022   NA 139 02/08/2022   K 4.3 02/08/2022   CL 103 02/08/2022   CO2 29 02/08/2022   BUN 17 02/08/2022   CREATININE 0.70 02/08/2022   GFR 103.88 02/08/2022   CALCIUM 9.9 02/08/2022   PROT 7.4 02/08/2022   ALBUMIN 4.7 02/08/2022   BILITOT 0.4 02/08/2022   ALKPHOS 46 02/08/2022   AST 15 02/08/2022   ALT 10 02/08/2022   Last lipids Lab Results  Component Value Date   CHOL 181 02/08/2022   HDL 52.50 02/08/2022   LDLCALC 116 (H) 02/08/2022   TRIG 66.0 02/08/2022   CHOLHDL 3 02/08/2022   Last hemoglobin A1c Lab Results  Component Value Date   HGBA1C 5.5 02/08/2022   Last thyroid functions Lab Results  Component Value Date   TSH 0.23 (L) 02/08/2022   T3TOTAL 143.9 01/31/2012   Last vitamin B12 and Folate Lab Results  Component Value Date   VITAMINB12 337 02/03/2014   IMPRESSION AND PLAN:  #1 post ablative hypothyroidism. Doing well. Continue levothyroxine 125 mcg tab, 1 daily 6 days a week. TSH today.  2.  Episodic anxiety.  Has done well long-term on 0.25 mg Xanax twice daily as needed.  She uses this sparingly.  Refilled today.  An After Visit Summary was printed and given to the patient.  FOLLOW UP: No follow-ups on file.  Signed:  Santiago Bumpers, MD           11/09/2022

## 2022-11-23 ENCOUNTER — Other Ambulatory Visit: Payer: Self-pay | Admitting: Family Medicine

## 2022-11-23 ENCOUNTER — Other Ambulatory Visit: Payer: Self-pay

## 2022-11-23 ENCOUNTER — Encounter: Payer: Self-pay | Admitting: Family Medicine

## 2022-11-23 MED ORDER — LEVOTHYROXINE SODIUM 125 MCG PO TABS: 125.0000 ug | ORAL_TABLET | Freq: Every day | ORAL | 3 refills | Status: DC

## 2023-04-26 ENCOUNTER — Ambulatory Visit: Payer: Self-pay | Admitting: Adult Health

## 2023-04-26 ENCOUNTER — Encounter: Payer: Self-pay | Admitting: Adult Health

## 2023-04-26 VITALS — BP 117/68 | HR 78 | Ht 60.0 in | Wt 127.5 lb

## 2023-04-26 DIAGNOSIS — N951 Menopausal and female climacteric states: Secondary | ICD-10-CM

## 2023-04-26 DIAGNOSIS — R232 Flushing: Secondary | ICD-10-CM

## 2023-04-26 DIAGNOSIS — R5383 Other fatigue: Secondary | ICD-10-CM

## 2023-04-26 DIAGNOSIS — R4189 Other symptoms and signs involving cognitive functions and awareness: Secondary | ICD-10-CM

## 2023-04-26 DIAGNOSIS — G479 Sleep disorder, unspecified: Secondary | ICD-10-CM | POA: Insufficient documentation

## 2023-04-26 DIAGNOSIS — N926 Irregular menstruation, unspecified: Secondary | ICD-10-CM | POA: Diagnosis not present

## 2023-04-26 DIAGNOSIS — R6882 Decreased libido: Secondary | ICD-10-CM

## 2023-04-26 DIAGNOSIS — R4589 Other symptoms and signs involving emotional state: Secondary | ICD-10-CM | POA: Insufficient documentation

## 2023-04-26 MED ORDER — LO LOESTRIN FE 1 MG-10 MCG / 10 MCG PO TABS: 1.0000 | ORAL_TABLET | Freq: Every day | ORAL | Status: DC

## 2023-04-26 NOTE — Progress Notes (Signed)
  Subjective:     Patient ID: Melissa Mullen, female   DOB: 05-20-1975, 48 y.o.   MRN: [id number]  HPI Tasnia is a 48 year old white female, married, G1P1, in complaining of brain fog, extreme fatigue, body aches, dizzy at times, not sleeping well, wakes between 1-3 am, moody, low sex drive, hot flashes, feels jittery, skipping period, then may have 2 in one month,and irritable.   Review of Systems +brain fog, extreme fatigue, body aches, dizzy at times, not sleeping well, wakes between 1-3 am, moody, low sex drive, hot flashes, feels jittery, skipping period, then may have 2 in one month,and irritable.  Denies MI,stroke,DVT,breast cancer or migraine with aura    Reviewed past medical,surgical, social and family history. Reviewed medications and allergies.  Objective:   Physical Exam BP 117/68 (BP Location: Left Arm, Patient Position: Sitting, Cuff Size: Normal)   Pulse 78   Ht 5' (1.524 m)   Wt 127 lb 8 oz (57.8 kg)   LMP 04/06/2023   BMI 24.90 kg/m     Skin warm and dry. Lungs: clear to ausculation bilaterally. Cardiovascular: regular rate and rhythm.  Fall risk is low  Upstream - 04/26/23 1041       Pregnancy Intention Screening   Does the patient want to become pregnant in the next year? No    Does the patient's partner want to become pregnant in the next year? No    Would the patient like to discuss contraceptive options today? No      Contraception Wrap Up   Current Method No Method - Other Reason    End Method No Method - Other Reason    Contraception Counseling Provided No             Assessment:     1. Peri-menopause (Primary) Discussed options, risks and benefits, will try low dose  COC Gave 3 packs lo Loestrin, can start today Meds ordered this encounter  Medications   Norethindrone-Ethinyl Estradiol-Fe Biphas (LO LOESTRIN FE) 1 MG-10 MCG / 10 MCG tablet    Sig: Take 1 tablet by mouth daily. Take 1 daily by mouth    BIN [id number], PCN [id number], GRP [id number],WG  [id number]    Supervising Provider:   Lazaro Arms [[id number]]    Review handout on perimenopause  2. Brain fog Bad brain fog, forgetful   3. Irregular periods Will skip period and may have 2 in one month  4. Other fatigue Extremely fatigued   5. Hot flashes  6. Sleep disturbance  7. Moody  8. Decreased sex drive    Plan:     Follow up 07/19/23 for ROS

## 2023-07-05 ENCOUNTER — Other Ambulatory Visit: Payer: Self-pay | Admitting: Family Medicine

## 2023-07-19 ENCOUNTER — Ambulatory Visit: Payer: 59 | Admitting: Adult Health

## 2023-08-26 ENCOUNTER — Other Ambulatory Visit: Payer: Self-pay | Admitting: Family Medicine

## 2023-08-27 NOTE — Telephone Encounter (Addendum)
 Pt has upcoming appt on 5/22, last refill 11/23/22 for 1 year supply

## 2023-08-30 ENCOUNTER — Ambulatory Visit (INDEPENDENT_AMBULATORY_CARE_PROVIDER_SITE_OTHER): Admitting: Family Medicine

## 2023-08-30 ENCOUNTER — Encounter: Payer: Self-pay | Admitting: Family Medicine

## 2023-08-30 VITALS — BP 125/76 | HR 77 | Temp 98.6°F | Ht 60.0 in | Wt 130.2 lb

## 2023-08-30 DIAGNOSIS — F419 Anxiety disorder, unspecified: Secondary | ICD-10-CM | POA: Diagnosis not present

## 2023-08-30 DIAGNOSIS — Z0000 Encounter for general adult medical examination without abnormal findings: Secondary | ICD-10-CM

## 2023-08-30 DIAGNOSIS — E890 Postprocedural hypothyroidism: Secondary | ICD-10-CM

## 2023-08-30 MED ORDER — CYCLOBENZAPRINE HCL 5 MG PO TABS: ORAL_TABLET | ORAL | 3 refills | Status: AC

## 2023-08-30 MED ORDER — CYCLOBENZAPRINE HCL 5 MG PO TABS: ORAL_TABLET | ORAL | 3 refills | Status: DC

## 2023-08-30 MED ORDER — ALPRAZOLAM 0.25 MG PO TABS: ORAL_TABLET | ORAL | 1 refills | Status: AC

## 2023-08-30 MED ORDER — MONTELUKAST SODIUM 10 MG PO TABS: 10.0000 mg | ORAL_TABLET | Freq: Every day | ORAL | 3 refills | Status: AC

## 2023-08-30 MED ORDER — LEVOTHYROXINE SODIUM 125 MCG PO TABS: 125.0000 ug | ORAL_TABLET | Freq: Every day | ORAL | 3 refills | Status: AC

## 2023-08-30 MED ORDER — MONTELUKAST SODIUM 10 MG PO TABS: 10.0000 mg | ORAL_TABLET | Freq: Every day | ORAL | 3 refills | Status: DC

## 2023-08-30 NOTE — Patient Instructions (Signed)

## 2023-08-30 NOTE — Progress Notes (Signed)
 Office Note 08/31/2023  CC:  Chief Complaint  Patient presents with   Medical Management of Chronic Issues    HPI:  Patient is a 48 y.o. female who is here for annual health maintenance exam and follow-up hypothyroidism and episodic anxiety.  Overall Yarnell feels well. She does feel like she has perimenopausal symptoms: Fatigue, occasional hot flash, sleep dysfunction, decreased sex drive.  Irregular menses. She was prescribed some OCPs by her GYN provider 4 months ago but she never picked up the prescription.  She has a tendency to get neck stiffness and Flexeril  has helped well for her in the past, needs refill. Seasonal allergies, she is ready to get back on her Singulair .  She uses alprazolam  rarely for excessive periods of anxiety and it works well. PMP AWARE reviewed today: most recent rx for alprazolam  0.25 mg was filled 01/03/2023, # 30, rx by me. No red flags.   Past Medical History:  Diagnosis Date   Anxiety and depression    Fibroids, intramural 10/23/2016   Hay fever    singulair  and zyrtec  are helpful   History of thyroid  cancer 2001   Migraine syndrome    very sporadic, triggers unknown   Nephrolithiasis 2012   No imaging has been done.   Postablative hypothyroidism 2002   TMJ arthralgia    flexeril  prn is helpful   Vaginal Pap smear, abnormal     Past Surgical History:  Procedure Laterality Date   IUD REMOVAL  2016   thyroid  ablation  2001   TONSILLECTOMY AND ADENOIDECTOMY  1980s   WISDOM TOOTH EXTRACTION  1996    Family History  Problem Relation Age of Onset   Hyperlipidemia Mother    Diabetes Father    Hypertension Father    Hyperlipidemia Father    Cancer Father        thyroid    Cancer Paternal Aunt        ovarian   Cancer Paternal Uncle    Diabetes Paternal Grandfather    Hypertension Paternal Grandfather    Stroke Paternal Grandfather     Social History   Socioeconomic History   Marital status: Married    Spouse name: Not  on file   Number of children: Not on file   Years of education: Not on file   Highest education level: Not on file  Occupational History   Not on file  Tobacco Use   Smoking status: Former    Current packs/day: 0.00    Types: Cigarettes    Quit date: 04/10/2010    Years since quitting: 13.4   Smokeless tobacco: Never  Vaping Use   Vaping status: Never Used  Substance and Sexual Activity   Alcohol use: Yes    Comment: occ   Drug use: No   Sexual activity: Yes    Birth control/protection: None  Other Topics Concern   Not on file  Social History Narrative   Married, mother of one son.   Stay at home mom.     No Tob, minimal alc.   Exercise: sporadically      Social Drivers of Corporate investment banker Strain: Low Risk  (09/08/2022)   Overall Financial Resource Strain (CARDIA)    Difficulty of Paying Living Expenses: Not hard at all  Food Insecurity: No Food Insecurity (09/08/2022)   Hunger Vital Sign    Worried About Running Out of Food in the Last Year: Never true    Ran Out of Food in the Last Year:  Never true  Transportation Needs: No Transportation Needs (09/08/2022)   PRAPARE - Administrator, Civil Service (Medical): No    Lack of Transportation (Non-Medical): No  Physical Activity: Sufficiently Active (09/08/2022)   Exercise Vital Sign    Days of Exercise per Week: 6 days    Minutes of Exercise per Session: 30 min  Stress: No Stress Concern Present (09/08/2022)   [employer] of Occupational Health - Occupational Stress Questionnaire    Feeling of Stress : Not at all  Social Connections: Moderately Integrated (09/08/2022)   Social Connection and Isolation Panel [NHANES]    Frequency of Communication with Friends and Family: More than three times a week    Frequency of Social Gatherings with Friends and Family: Twice a week    Attends Religious Services: 1 to 4 times per year    Active Member of [employer] or Organizations: No    Attends Tax inspector Meetings: Never    Marital Status: Married  Catering manager Violence: Not At Risk (09/08/2022)   Humiliation, Afraid, Rape, and Kick questionnaire    Fear of Current or Ex-Partner: No    Emotionally Abused: No    Physically Abused: No    Sexually Abused: No    Outpatient Medications Prior to Visit  Medication Sig Dispense Refill   ALPRAZolam  (XANAX ) 0.25 MG tablet TAKE ONE TABLET BY MOUTH TWICE A DAY AS NEEDED FOR ANXIETY 30 tablet 1   levothyroxine  (SYNTHROID ) 125 MCG tablet Take 1 tablet (125 mcg total) by mouth daily. 90 tablet 3   montelukast  (SINGULAIR ) 10 MG tablet TAKE 1 TABLET AT BEDTIME 90 tablet 1   EPINEPHrine  (AUVI-Q ) 0.3 mg/0.3 mL IJ SOAJ injection Inject 0.3 mLs (0.3 mg total) into the muscle as needed for anaphylaxis. (Patient not taking: Reported on 08/30/2023) 2 each 1   cyclobenzaprine  (FLEXERIL ) 5 MG tablet TAKE ONE (1) TABLET BY MOUTH 3 TIMES DAILY AS NEEDED FOR TMJ PAIN (Patient not taking: Reported on 08/30/2023) 30 tablet 3   diphenhydrAMINE (BENADRYL) 25 MG tablet Take 50 mg by mouth once as needed for itching or allergies (TAKEN ONCE IN RESPONSE TO ALLERGIC REACTION). (Patient not taking: Reported on 08/30/2023)     Norethindrone -Ethinyl Estradiol-Fe Biphas (LO LOESTRIN FE ) 1 MG-10 MCG / 10 MCG tablet Take 1 tablet by mouth daily. Take 1 daily by mouth (Patient not taking: Reported on 08/30/2023)     No facility-administered medications prior to visit.    Allergies  Allergen Reactions   Bee Venom Itching and Swelling   Codeine Nausea Only    Review of Systems  Constitutional:  Positive for fatigue. Negative for appetite change, chills and fever.  HENT:  Negative for congestion, dental problem, ear pain and sore throat.   Eyes:  Negative for discharge, redness and visual disturbance.  Respiratory:  Negative for cough, chest tightness, shortness of breath and wheezing.   Cardiovascular:  Negative for chest pain, palpitations and leg swelling.   Gastrointestinal:  Negative for abdominal pain, blood in stool, diarrhea, nausea and vomiting.  Endocrine: Negative for cold intolerance, heat intolerance, polydipsia, polyphagia and polyuria.  Genitourinary:  Negative for difficulty urinating, dysuria, flank pain, frequency, hematuria and urgency.  Musculoskeletal:  Negative for arthralgias, back pain, joint swelling, myalgias and neck stiffness.  Skin:  Negative for pallor and rash.  Neurological:  Negative for dizziness, speech difficulty, weakness and headaches.  Hematological:  Negative for adenopathy. Does not bruise/bleed easily.  Psychiatric/Behavioral:  Negative for confusion and sleep  disturbance. The patient is not nervous/anxious.    PE;    08/30/2023    2:58 PM 04/26/2023   10:38 AM 11/09/2022    3:32 PM  Vitals with BMI  Height 5\' 0"  5\' 0"    Weight 130 lbs 3 oz 127 lbs 8 oz 130 lbs 10 oz  BMI 25.43 24.9   Systolic 125 117 409  Diastolic 76 68 69  Pulse 77 78 79   Gen: Alert, well appearing.  Patient is oriented to person, place, time, and situation. AFFECT: pleasant, lucid thought and speech. ENT: Ears: EACs clear, normal epithelium.  TMs with good light reflex and landmarks bilaterally.  Eyes: no injection, icteris, swelling, or exudate.  EOMI, PERRLA. Nose: no drainage or turbinate edema/swelling.  No injection or focal lesion.  Mouth: lips without lesion/swelling.  Oral mucosa pink and moist.  Dentition intact and without obvious caries or gingival swelling.  Oropharynx without erythema, exudate, or swelling.  Neck: supple/nontender.  No LAD, mass, or TM.  Carotid pulses 2+ bilaterally, without bruits. CV: RRR, no m/r/g.   LUNGS: CTA bilat, nonlabored resps, good aeration in all lung fields. ABD: soft, NT, ND, BS normal.  No hepatospenomegaly or mass.  No bruits. EXT: no clubbing, cyanosis, or edema.  Musculoskeletal: no joint swelling, erythema, warmth, or tenderness.  ROM of all joints intact. Skin - no sores or  suspicious lesions or rashes or color changes  Pertinent labs:  Lab Results  Component Value Date   TSH 0.13 (L) 08/30/2023   Lab Results  Component Value Date   WBC 8.4 08/30/2023   HGB 13.8 08/30/2023   HCT 41.7 08/30/2023   MCV 91.2 08/30/2023   PLT 376 08/30/2023   Lab Results  Component Value Date   CREATININE 1.34 (H) 08/30/2023   BUN 25 08/30/2023   NA 137 08/30/2023   K 4.3 08/30/2023   CL 104 08/30/2023   CO2 25 08/30/2023   Lab Results  Component Value Date   ALT 11 08/30/2023   AST 13 08/30/2023   ALKPHOS 46 02/08/2022   BILITOT 0.2 08/30/2023   Lab Results  Component Value Date   CHOL 206 (H) 08/30/2023   Lab Results  Component Value Date   HDL 66 08/30/2023   Lab Results  Component Value Date   LDLCALC 123 (H) 08/30/2023   Lab Results  Component Value Date   TRIG 80 08/30/2023   Lab Results  Component Value Date   CHOLHDL 3.1 08/30/2023   Lab Results  Component Value Date   HGBA1C 5.5 02/08/2022   ASSESSMENT AND PLAN:   #1 health maintenance exam: Reviewed age and gender appropriate health maintenance issues (prudent diet, regular exercise, health risks of tobacco and excessive alcohol, use of seatbelts, fire alarms in home, use of sunscreen).  Also reviewed age and gender appropriate health screening as well as vaccine recommendations. Vaccines: All up-to-date Labs: fasting HP today Cervical ca screening: Per GYN Breast ca screening: Per GYN Colon ca screening: average risk patient= as per latest guidelines, start screening at 45-50 yrs of age-->she defers at this time.  #2 episodic anxiety, alprazolam  works well and she uses this rarely. Refill prescription today, #30 of the 0.25 mg tabs, refill x 1.  There  3.  Post ablative hypothyroidism.  Goal TSH around 0.1. Continue levothyroxine  125 mcg daily. TSH monitoring today.  An After Visit Summary was printed and given to the patient.  FOLLOW UP:  Return in about 6 months (around  03/01/2024) for routine chronic illness f/u.  Signed:  Arletha Lady, MD           08/31/2023

## 2023-08-31 ENCOUNTER — Ambulatory Visit: Payer: Self-pay | Admitting: Family Medicine

## 2023-08-31 DIAGNOSIS — R7989 Other specified abnormal findings of blood chemistry: Secondary | ICD-10-CM

## 2023-08-31 LAB — LIPID PANEL
Cholesterol: 206 mg/dL — ABNORMAL HIGH (ref <200)
HDL: 66 mg/dL (ref >=50)
LDL Cholesterol (Calc): 123 mg/dL — ABNORMAL HIGH
Non-HDL Cholesterol (Calc): 140 mg/dL — ABNORMAL HIGH (ref <130)
Total CHOL/HDL Ratio: 3.1 (calc) (ref <5.0)
Triglycerides: 80 mg/dL (ref <150)

## 2023-08-31 LAB — COMPREHENSIVE METABOLIC PANEL WITH GFR
AG Ratio: 2.1 (calc) (ref 1.0–2.5)
ALT: 11 U/L (ref 6–29)
AST: 13 U/L (ref 10–35)
Albumin: 4.5 g/dL (ref 3.6–5.1)
Alkaline phosphatase (APISO): 43 U/L (ref 31–125)
BUN/Creatinine Ratio: 19 (calc) (ref 6–22)
BUN: 25 mg/dL (ref 7–25)
CO2: 25 mmol/L (ref 20–32)
Calcium: 9.5 mg/dL (ref 8.6–10.2)
Chloride: 104 mmol/L (ref 98–110)
Creat: 1.34 mg/dL — ABNORMAL HIGH (ref 0.50–0.99)
Globulin: 2.1 g/dL (ref 1.9–3.7)
Glucose, Bld: 91 mg/dL (ref 65–99)
Potassium: 4.3 mmol/L (ref 3.5–5.3)
Sodium: 137 mmol/L (ref 135–146)
Total Bilirubin: 0.2 mg/dL (ref 0.2–1.2)
Total Protein: 6.6 g/dL (ref 6.1–8.1)
eGFR: 49 mL/min/{1.73_m2} — ABNORMAL LOW (ref >=60)

## 2023-08-31 LAB — CBC WITH DIFFERENTIAL/PLATELET
Absolute Lymphocytes: 2478 {cells}/uL (ref 850–3900)
Absolute Monocytes: 697 {cells}/uL (ref 200–950)
Basophils Absolute: 59 {cells}/uL (ref 0–200)
Basophils Relative: 0.7 %
Eosinophils Absolute: 101 {cells}/uL (ref 15–500)
Eosinophils Relative: 1.2 %
HCT: 41.7 % (ref 35.0–45.0)
Hemoglobin: 13.8 g/dL (ref 11.7–15.5)
MCH: 30.2 pg (ref 27.0–33.0)
MCHC: 33.1 g/dL (ref 32.0–36.0)
MCV: 91.2 fL (ref 80.0–100.0)
MPV: 9.6 fL (ref 7.5–12.5)
Monocytes Relative: 8.3 %
Neutro Abs: 5065 {cells}/uL (ref 1500–7800)
Neutrophils Relative %: 60.3 %
Platelets: 376 10*3/uL (ref 140–400)
RBC: 4.57 10*6/uL (ref 3.80–5.10)
RDW: 13.1 % (ref 11.0–15.0)
Total Lymphocyte: 29.5 %
WBC: 8.4 10*3/uL (ref 3.8–10.8)

## 2023-08-31 LAB — TSH: TSH: 0.13 m[IU]/L — ABNORMAL LOW

## 2023-08-31 NOTE — Telephone Encounter (Signed)
 FYI  Please see below

## 2023-09-04 ENCOUNTER — Other Ambulatory Visit

## 2023-09-04 DIAGNOSIS — R7989 Other specified abnormal findings of blood chemistry: Secondary | ICD-10-CM

## 2023-09-05 ENCOUNTER — Ambulatory Visit: Payer: Self-pay | Admitting: Family Medicine

## 2023-09-05 DIAGNOSIS — R7989 Other specified abnormal findings of blood chemistry: Secondary | ICD-10-CM

## 2023-09-05 LAB — URINALYSIS, ROUTINE W REFLEX MICROSCOPIC
Bacteria, UA: NONE SEEN /HPF
Bilirubin Urine: NEGATIVE
Glucose, UA: NEGATIVE
Hyaline Cast: NONE SEEN /LPF
Ketones, ur: NEGATIVE
Nitrite: NEGATIVE
Protein, ur: NEGATIVE
RBC / HPF: NONE SEEN /HPF (ref 0–2)
Specific Gravity, Urine: 1.009 (ref 1.001–1.035)
Squamous Epithelial / HPF: NONE SEEN /HPF (ref <=5)
WBC, UA: NONE SEEN /HPF (ref 0–5)
pH: 8 (ref 5.0–8.0)

## 2023-09-05 LAB — BASIC METABOLIC PANEL WITH GFR
BUN: 16 mg/dL (ref 7–25)
CO2: 26 mmol/L (ref 20–32)
Calcium: 10 mg/dL (ref 8.6–10.2)
Chloride: 104 mmol/L (ref 98–110)
Creat: 0.72 mg/dL (ref 0.50–0.99)
Glucose, Bld: 87 mg/dL (ref 65–99)
Potassium: 4.3 mmol/L (ref 3.5–5.3)
Sodium: 139 mmol/L (ref 135–146)
eGFR: 104 mL/min/{1.73_m2} (ref >=60)

## 2023-09-05 LAB — URINE CULTURE
MICRO NUMBER:: 16502705
Result:: NO GROWTH
SPECIMEN QUALITY:: ADEQUATE

## 2023-09-05 LAB — MICROSCOPIC MESSAGE

## 2023-09-05 NOTE — Telephone Encounter (Signed)
 Sure. Melissa Mullen can you take care of this?

## 2023-11-21 ENCOUNTER — Other Ambulatory Visit: Payer: Self-pay | Admitting: Family Medicine

## 2023-11-22 ENCOUNTER — Ambulatory Visit: Payer: Self-pay | Admitting: Family Medicine

## 2023-11-22 LAB — BASIC METABOLIC PANEL WITH GFR
BUN/Creatinine Ratio: 18 (ref 9–23)
BUN: 14 mg/dL (ref 6–24)
CO2: 22 mmol/L (ref 20–29)
Calcium: 9.9 mg/dL (ref 8.7–10.2)
Chloride: 103 mmol/L (ref 96–106)
Creatinine, Ser: 0.77 mg/dL (ref 0.57–1.00)
Glucose: 105 mg/dL — ABNORMAL HIGH (ref 70–99)
Potassium: 4.4 mmol/L (ref 3.5–5.2)
Sodium: 139 mmol/L (ref 134–144)
eGFR: 96 mL/min/1.73 (ref >59)

## 2023-11-24 ENCOUNTER — Emergency Department (HOSPITAL_COMMUNITY)
Admission: EM | Admit: 2023-11-24 | Discharge: 2023-11-24 | Disposition: A | Discharge status: Home / Self Care | Attending: Emergency Medicine | Admitting: Emergency Medicine

## 2023-11-24 ENCOUNTER — Encounter (HOSPITAL_COMMUNITY): Payer: Self-pay

## 2023-11-24 ENCOUNTER — Other Ambulatory Visit: Payer: Self-pay

## 2023-11-24 DIAGNOSIS — L509 Urticaria, unspecified: Secondary | ICD-10-CM | POA: Diagnosis present

## 2023-11-24 DIAGNOSIS — E039 Hypothyroidism, unspecified: Secondary | ICD-10-CM | POA: Diagnosis not present

## 2023-11-24 DIAGNOSIS — T7840XA Allergy, unspecified, initial encounter: Secondary | ICD-10-CM | POA: Diagnosis not present

## 2023-11-24 DIAGNOSIS — Z79899 Other long term (current) drug therapy: Secondary | ICD-10-CM | POA: Diagnosis not present

## 2023-11-24 MED ORDER — EPINEPHRINE 0.3 MG/0.3ML IJ SOAJ
0.3000 mg | INTRAMUSCULAR | 0 refills | Status: AC | PRN
Start: 2023-11-24 — End: ?

## 2023-11-24 MED ORDER — PREDNISONE 20 MG PO TABS: 40.0000 mg | ORAL_TABLET | Freq: Every day | ORAL | 0 refills | Status: AC

## 2023-11-24 MED ORDER — FAMOTIDINE IN NACL 20-0.9 MG/50ML-% IV SOLN
20.0000 mg | Freq: Once | INTRAVENOUS | Status: AC
  Administered 2023-11-24: 20 mg via INTRAVENOUS
  Filled 2023-11-24: qty 50

## 2023-11-24 MED ORDER — DIPHENHYDRAMINE HCL 50 MG/ML IJ SOLN
25.0000 mg | Freq: Once | INTRAMUSCULAR | Status: AC
  Administered 2023-11-24: 25 mg via INTRAVENOUS
  Filled 2023-11-24: qty 1

## 2023-11-24 MED ORDER — METHYLPREDNISOLONE SODIUM SUCC 125 MG IJ SOLR
125.0000 mg | Freq: Once | INTRAMUSCULAR | Status: AC
  Administered 2023-11-24: 125 mg via INTRAVENOUS
  Filled 2023-11-24: qty 2

## 2023-11-24 MED ORDER — EPINEPHRINE 0.3 MG/0.3ML IJ SOAJ
0.3000 mg | Freq: Once | INTRAMUSCULAR | Status: AC
  Administered 2023-11-24: 0.3 mg via INTRAMUSCULAR
  Filled 2023-11-24: qty 0.3

## 2023-11-24 MED ORDER — FAMOTIDINE 20 MG PO TABS: 20.0000 mg | ORAL_TABLET | Freq: Two times a day (BID) | ORAL | 0 refills | Status: AC

## 2023-11-24 NOTE — ED Provider Notes (Signed)
 [place] EMERGENCY DEPARTMENT AT [facility] Provider Note   CSN: [id number] Arrival date & time: 11/24/23  1122     Patient presents with: Allergic Reaction   Melissa Mullen is a 48 y.o. female.    Allergic Reaction  This patient is a 48 year old female with a known history of anxiety on alprazolam , history of hypothyroidism on Synthroid  and a history of severe reactions to bee stings.  She states she was on a walk in the forest and had a sting on her left hand, you can see some redness laterally on the dorsum of the left hand.  Within a short timeframe she started to develop hives and itching and became very anxious and jittery, no difficulty breathing no swelling of the throat, she took 2 Benadryl  and came to the hospital.  She did not use her EpiPen  but states that she does have 1    Prior to Admission medications   Medication Sig Start Date End Date Taking? Authorizing Provider  EPINEPHrine  0.3 mg/0.3 mL IJ SOAJ injection Inject 0.3 mg into the muscle as needed for anaphylaxis. 11/24/23  Yes Pinal Rogue, MD  famotidine  (PEPCID ) 20 MG tablet Take 1 tablet (20 mg total) by mouth 2 (two) times daily. 11/24/23  Yes Pinal Rogue, MD  predniSONE  (DELTASONE ) 20 MG tablet Take 2 tablets (40 mg total) by mouth daily. 11/24/23  Yes Pinal Rogue, MD  ALPRAZolam  (XANAX ) 0.25 MG tablet TAKE ONE TABLET BY MOUTH TWICE A DAY AS NEEDED FOR ANXIETY 08/30/23   McGowen, Aleene DEL, MD  cyclobenzaprine  (FLEXERIL ) 5 MG tablet TAKE ONE (1) TABLET BY MOUTH 3 TIMES DAILY AS NEEDED FOR TMJ PAIN 08/30/23   McGowen, Aleene DEL, MD  levothyroxine  (SYNTHROID ) 125 MCG tablet Take 1 tablet (125 mcg total) by mouth daily. 08/30/23   McGowen, Aleene DEL, MD  montelukast  (SINGULAIR ) 10 MG tablet Take 1 tablet (10 mg total) by mouth at bedtime. 08/30/23   McGowen, Aleene DEL, MD    Allergies: Bee venom and Codeine    Review of Systems  All other systems reviewed and are negative.   Updated Vital Signs BP  125/67   Pulse 98   Resp 17   Ht 1.626 m (5' 4)   Wt 61.2 kg   SpO2 100%   BMI 23.17 kg/m   Physical Exam Vitals and nursing note reviewed.  Constitutional:      General: She is in acute distress.     Appearance: She is well-developed.  HENT:     Head: Normocephalic and atraumatic.     Mouth/Throat:     Pharynx: No oropharyngeal exudate.  Eyes:     General: No scleral icterus.       Right eye: No discharge.        Left eye: No discharge.     Conjunctiva/sclera: Conjunctivae normal.     Pupils: Pupils are equal, round, and reactive to light.  Neck:     Thyroid : No thyromegaly.     Vascular: No JVD.  Cardiovascular:     Rate and Rhythm: Regular rhythm. Tachycardia present.     Heart sounds: Normal heart sounds. No murmur heard.    No friction rub. No gallop.  Pulmonary:     Effort: Pulmonary effort is normal. No respiratory distress.     Breath sounds: Normal breath sounds. No wheezing or rales.  Abdominal:     General: Bowel sounds are normal. There is no distension.     Palpations: Abdomen is  soft. There is no mass.     Tenderness: There is no abdominal tenderness.  Musculoskeletal:        General: No tenderness. Normal range of motion.     Cervical back: Normal range of motion and neck supple.  Lymphadenopathy:     Cervical: No cervical adenopathy.  Skin:    General: Skin is warm and dry.     Findings: Erythema and rash present.     Comments: Urticaria across the trunk, extremities are free of urticaria, local toxin envenomation of the left hand with redness  Neurological:     Mental Status: She is alert.     Coordination: Coordination normal.  Psychiatric:        Behavior: Behavior normal.     (all labs ordered are listed, but only abnormal results are displayed) Labs Reviewed - No data to display  EKG: None  Radiology: No results found.   Procedures   Medications Ordered in the ED  EPINEPHrine  (EPI-PEN) injection 0.3 mg (0.3 mg Intramuscular  Given 11/24/23 1133)  methylPREDNISolone  sodium succinate (SOLU-MEDROL ) 125 mg/2 mL injection 125 mg (125 mg Intravenous Given 11/24/23 1137)  diphenhydrAMINE  (BENADRYL ) injection 25 mg (25 mg Intravenous Given 11/24/23 1135)  famotidine  (PEPCID ) IVPB 20 mg premix (20 mg Intravenous New Bag/Given 11/24/23 1139)                                    Medical Decision Making Risk Prescription drug management.    This patient presents to the ED for concern of allergic reaction differential diagnosis includes anaphylaxis    Additional history obtained   Additional history obtained from Electronic Medical Record External records from outside source obtained and reviewed including patient has severe history of reactions to bee stings, follows in the office closely with family doctor most recently 2 months ago, no evaluations in the hospital for many years    Medicines ordered and prescription drug management:  I ordered medication including Benadryl , Solu-Medrol , epinephrine , Pepcid     I have reviewed the patients home medicines and have made adjustments as needed   Problem List / ED Course:  Improved, will need observation emergency department for several hours The patient was observed for approximately 4 hours and did very well, no symptoms recurring after several hours after injection.    Known allergic reaction        Final diagnoses:  Allergic reaction, initial encounter    ED Discharge Orders          Ordered    predniSONE  (DELTASONE ) 20 MG tablet  Daily        11/24/23 1459    famotidine  (PEPCID ) 20 MG tablet  2 times daily        11/24/23 1459    EPINEPHrine  0.3 mg/0.3 mL IJ SOAJ injection  As needed        11/24/23 1459               Cleotilde Rogue, MD 11/24/23 1501

## 2023-11-24 NOTE — ED Triage Notes (Signed)
 Pt was stung by a bee 30 min ago and is severely allergic. Pt currently talking to triage nurse but is in visible panic and stated that she is beginning to feel dizzy and has dry mouth. Itching on feet, arms and trunk

## 2023-11-24 NOTE — Discharge Instructions (Signed)
 Thankfully your symptoms have resolved, you will need to take the following medications over the next few days   Prednisone  is a steroid that helps to reduce certain types of inflammation and may be used for allergic reactions, some rashes such as poison ivy or dermatitis, for asthma attacks or bronchitis and for certain types of pain.  Please take this medicine exactly as prescribed - 40mg  by mouth daily for 5 days.  This can have certain side effects with some people including feeling like you can't sleep, feeling anxious or feeling like you are on a high.  It should not cause weight gain if only taken for a short time.  Please be aware that this medication may also cause an elevation in your blood sugar if you are a diabetic so if you are a diabetic you will need to keep a very close eye on your blood sugar, make sure that you are eating an extremely low level of carbohydrates and taking your medications exactly as prescribed.  If you should develop severe high blood sugar or start to feel poorly return to the emergency department immediately   Benadryl  1 to 2 tablets every 6 hours as needed for rash itching or hives  Pepcid  twice a day  EpiPen  has been prescribed again so that you have a fresh one at home.  If you develop severe worsening symptoms please return to the emergency department immediately.

## 2023-11-26 NOTE — Transitions of Care (Post Inpatient/ED Visit) (Signed)
   11/26/2023  Name: Melissa Mullen MRN: [id number] DOB: Jun 27, 1975  Today's TOC FU Call Status: Today's TOC FU Call Status:: Successful TOC FU Call Completed TOC FU Call Complete Date: 11/26/23 Patient's Name and Date of Birth confirmed.  Transition Care Management Follow-up Telephone Call Date of Discharge: 11/24/23 [facility] Type of Discharge: Emergency Department Reason for ED Visit: Other: How have you been since you were released from the hospital?: Same Any questions or concerns?: No  Items Reviewed: Did you receive and understand the discharge instructions provided?: Yes Medications obtained,verified, and reconciled?: Yes (Medications Reviewed) Any new allergies since your discharge?: No Dietary orders reviewed?: NA Do you have support at home?: Yes  Medications Reviewed Today: Medications Reviewed Today     Reviewed by Claudene Shanda ORN, CMA (Certified Medical Assistant) on 11/26/23 at [phone]  Med List Status: <None>   Medication Order Taking? Sig Documenting Provider Last Dose Status Informant  ALPRAZolam  (XANAX ) 0.25 MG tablet [id number]  TAKE ONE TABLET BY MOUTH TWICE A DAY AS NEEDED FOR ANXIETY McGowen, Aleene DEL, MD  Active   cyclobenzaprine  (FLEXERIL ) 5 MG tablet [id number]  TAKE ONE (1) TABLET BY MOUTH 3 TIMES DAILY AS NEEDED FOR TMJ PAIN McGowen, Aleene DEL, MD  Active   EPINEPHrine  0.3 mg/0.3 mL IJ SOAJ injection [id number]  Inject 0.3 mg into the muscle as needed for anaphylaxis. Pinal Rogue, MD  Active   famotidine  (PEPCID ) 20 MG tablet [id number]  Take 1 tablet (20 mg total) by mouth 2 (two) times daily. Pinal Rogue, MD  Active   levothyroxine  (SYNTHROID ) 125 MCG tablet [id number]  Take 1 tablet (125 mcg total) by mouth daily. McGowen, Philip H, MD  Active   montelukast  (SINGULAIR ) 10 MG tablet [id number]  Take 1 tablet (10 mg total) by mouth at bedtime. McGowen, Philip H, MD  Active   predniSONE  (DELTASONE ) 20 MG tablet [id number]  Take 2  tablets (40 mg total) by mouth daily. Pinal Rogue, MD  Active             Home Care and Equipment/Supplies: Were Home Health Services Ordered?: NA Any new equipment or medical supplies ordered?: NA  Functional Questionnaire: Do you need assistance with bathing/showering or dressing?: No Do you need assistance with meal preparation?: No Do you need assistance with eating?: No Do you have difficulty maintaining continence: No Do you need assistance with getting out of bed/getting out of a chair/moving?: No Do you have difficulty managing or taking your medications?: No  Follow up appointments reviewed: PCP Follow-up appointment confirmed?: NA Specialist Hospital Follow-up appointment confirmed?: NA Do you need transportation to your follow-up appointment?: No Do you understand care options if your condition(s) worsen?: Yes-patient verbalized understanding    SIGNATUREVanessa Claudene
